# Patient Record
Sex: Female | Born: 1975 | Race: White | Hispanic: No | Smoking: Current every day smoker
Health system: Southern US, Community
[De-identification: ages and names within clinical notes are randomized; demographics above are authoritative.]

## PROBLEM LIST (undated history)

## (undated) DIAGNOSIS — F418 Other specified anxiety disorders: Secondary | ICD-10-CM

## (undated) DIAGNOSIS — I10 Essential (primary) hypertension: Secondary | ICD-10-CM

## (undated) DIAGNOSIS — K219 Gastro-esophageal reflux disease without esophagitis: Secondary | ICD-10-CM

## (undated) DIAGNOSIS — H6690 Otitis media, unspecified, unspecified ear: Secondary | ICD-10-CM

## (undated) HISTORY — PX: TUBAL LIGATION: SHX77

## (undated) HISTORY — DX: Other specified anxiety disorders: F41.8

## (undated) HISTORY — PX: OTHER SURGICAL HISTORY: SHX169

## (undated) HISTORY — PX: ENDOMETRIAL ABLATION: SHX621

---

## 2001-08-17 ENCOUNTER — Encounter: Payer: Self-pay | Admitting: Family Medicine

## 2001-08-17 ENCOUNTER — Ambulatory Visit (HOSPITAL_COMMUNITY): Admission: RE | Admit: 2001-08-17 | Discharge: 2001-08-17 | Payer: Self-pay | Admitting: Family Medicine

## 2002-03-12 ENCOUNTER — Emergency Department (HOSPITAL_COMMUNITY): Admission: EM | Admit: 2002-03-12 | Discharge: 2002-03-12 | Payer: Self-pay | Admitting: Emergency Medicine

## 2002-05-02 ENCOUNTER — Emergency Department (HOSPITAL_COMMUNITY): Admission: EM | Admit: 2002-05-02 | Discharge: 2002-05-02 | Payer: Self-pay | Admitting: Emergency Medicine

## 2002-05-02 ENCOUNTER — Encounter: Payer: Self-pay | Admitting: Emergency Medicine

## 2002-05-10 ENCOUNTER — Emergency Department (HOSPITAL_COMMUNITY): Admission: EM | Admit: 2002-05-10 | Discharge: 2002-05-10 | Payer: Self-pay | Admitting: Emergency Medicine

## 2002-06-13 ENCOUNTER — Emergency Department (HOSPITAL_COMMUNITY): Admission: EM | Admit: 2002-06-13 | Discharge: 2002-06-13 | Payer: Self-pay | Admitting: Emergency Medicine

## 2002-07-30 ENCOUNTER — Ambulatory Visit (HOSPITAL_COMMUNITY): Admission: RE | Admit: 2002-07-30 | Discharge: 2002-07-30 | Payer: Self-pay | Admitting: Pulmonary Disease

## 2003-11-19 ENCOUNTER — Inpatient Hospital Stay (HOSPITAL_COMMUNITY): Admission: RE | Admit: 2003-11-19 | Discharge: 2003-11-21 | Payer: Self-pay | Admitting: Obstetrics and Gynecology

## 2005-04-05 ENCOUNTER — Ambulatory Visit (HOSPITAL_COMMUNITY): Admission: RE | Admit: 2005-04-05 | Discharge: 2005-04-05 | Payer: Self-pay | Admitting: Pulmonary Disease

## 2006-01-05 ENCOUNTER — Ambulatory Visit (HOSPITAL_COMMUNITY): Admission: RE | Admit: 2006-01-05 | Discharge: 2006-01-05 | Payer: Self-pay | Admitting: Obstetrics & Gynecology

## 2006-01-18 ENCOUNTER — Ambulatory Visit (HOSPITAL_COMMUNITY): Admission: RE | Admit: 2006-01-18 | Discharge: 2006-01-18 | Payer: Self-pay | Admitting: Obstetrics and Gynecology

## 2006-02-02 ENCOUNTER — Encounter (HOSPITAL_COMMUNITY): Admission: RE | Admit: 2006-02-02 | Discharge: 2006-03-04 | Payer: Self-pay | Admitting: Obstetrics and Gynecology

## 2006-03-30 ENCOUNTER — Encounter (INDEPENDENT_AMBULATORY_CARE_PROVIDER_SITE_OTHER): Payer: Self-pay | Admitting: *Deleted

## 2006-03-30 ENCOUNTER — Other Ambulatory Visit: Admission: RE | Admit: 2006-03-30 | Discharge: 2006-03-30 | Payer: Self-pay | Admitting: Obstetrics and Gynecology

## 2006-04-14 IMAGING — US US ABDOMEN COMPLETE
1 series · 14 of 25 positions shown · non-contrast
Comparison: none

CLINICAL DATA: Right upper abdominal pain, nausea

[Series 1: unknown · 0.33mm/px · 14 of 60 slices shown]
[im 1/60]
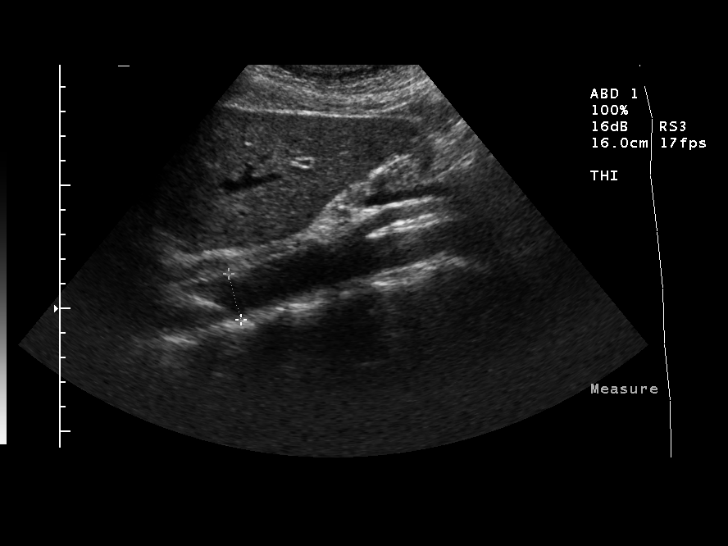
[im 5/60]
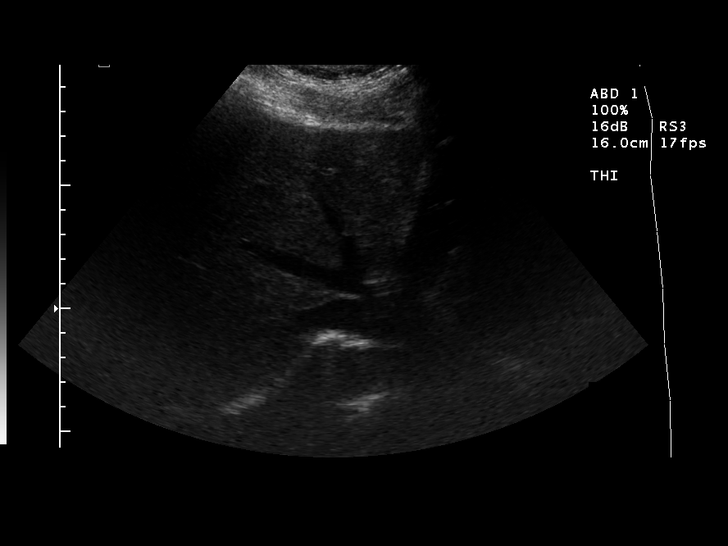
[im 10/60]
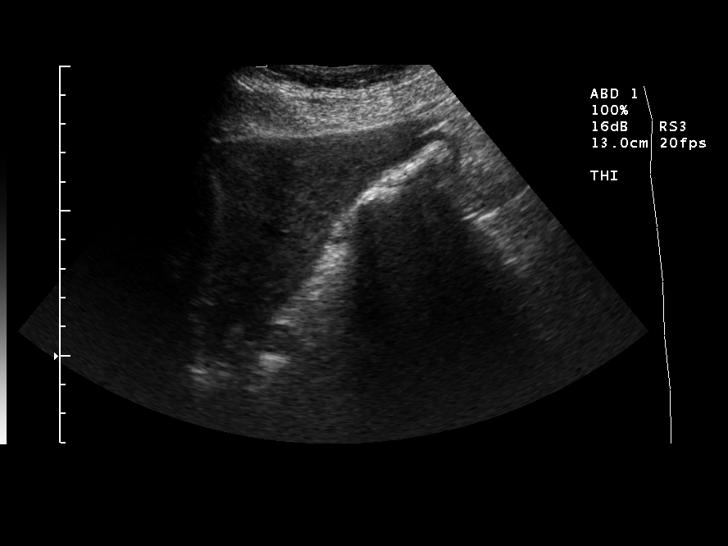
[im 15/60]
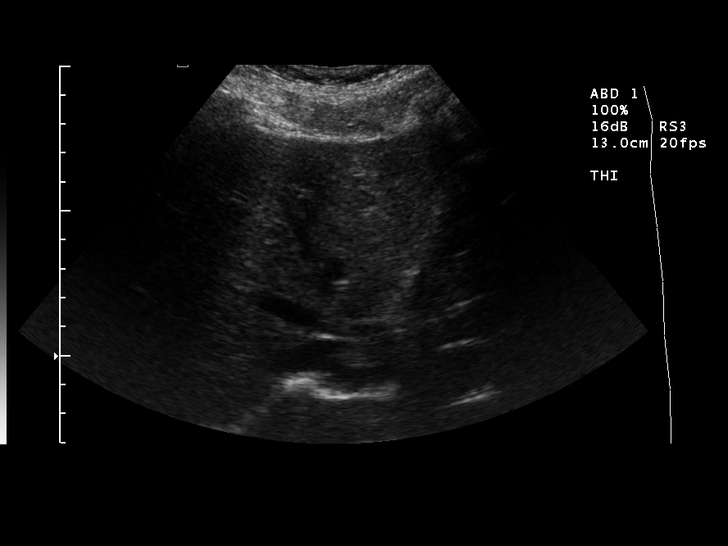
[im 20/60]
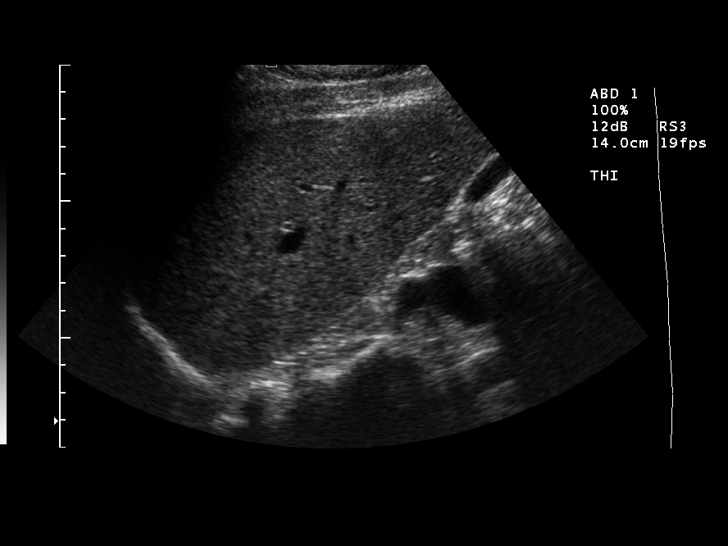
[im 23/60]
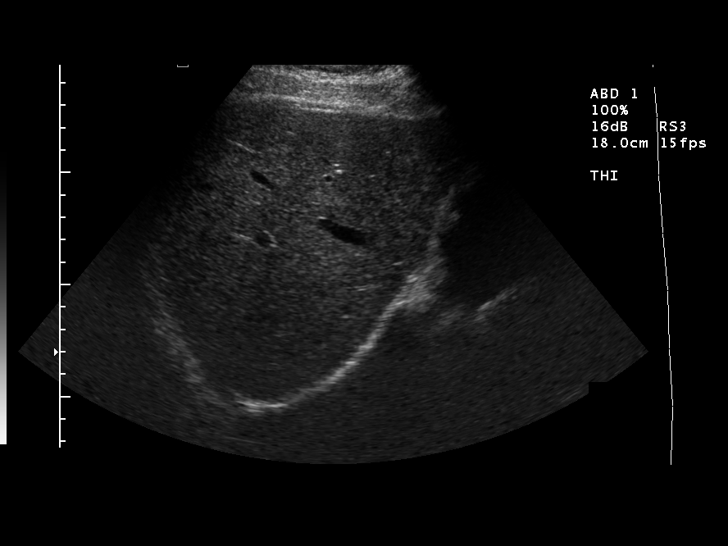
[im 28/60]
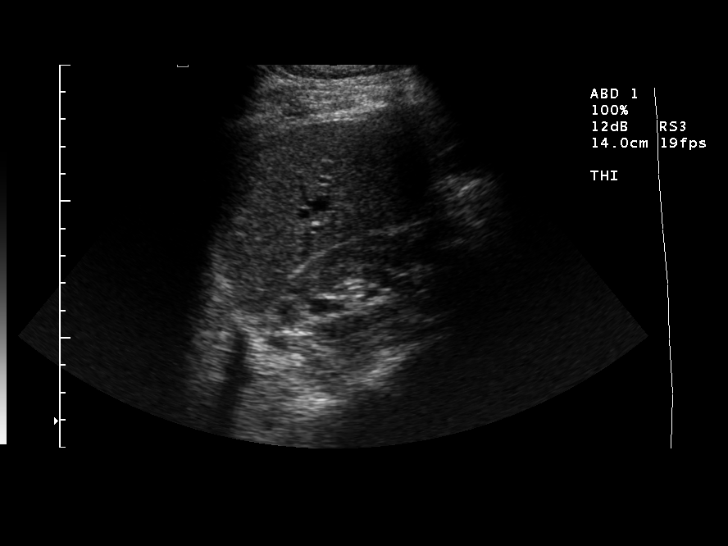
[im 32/60]
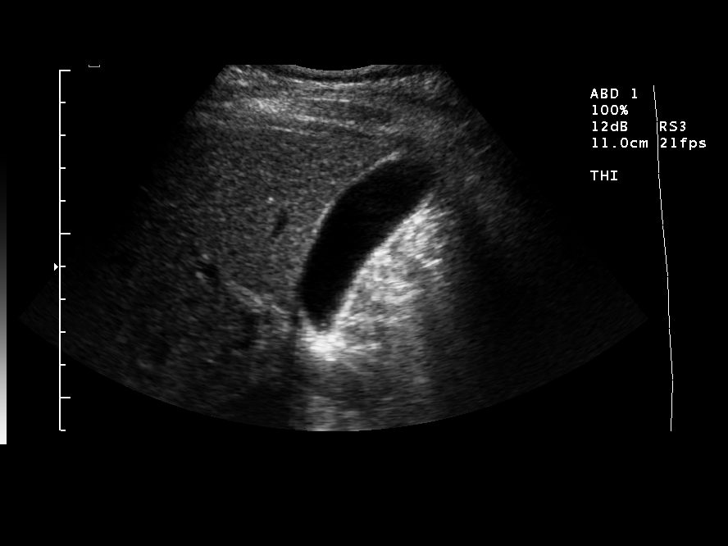
[im 37/60]
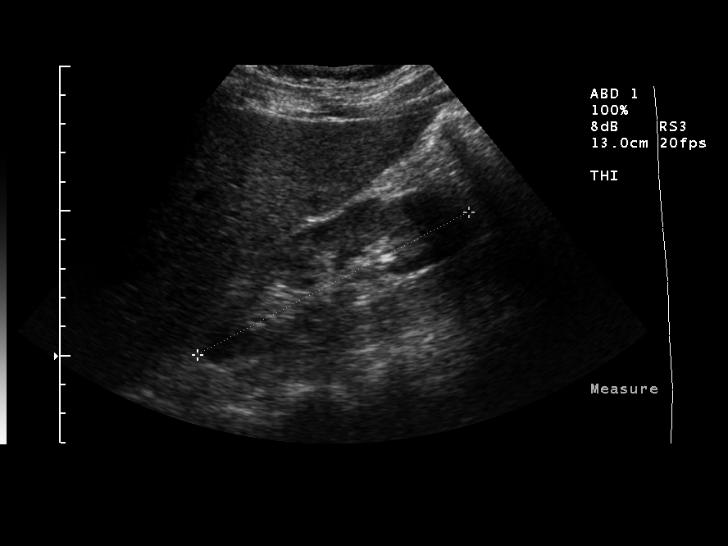
[im 40/60]
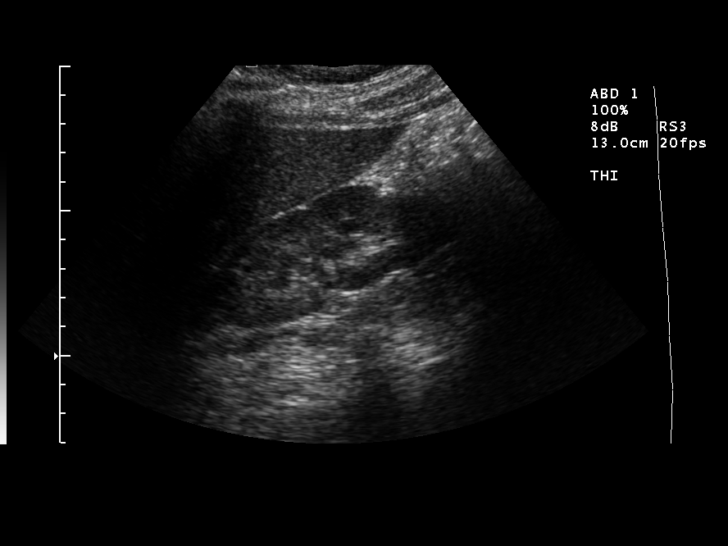
[im 45/60]
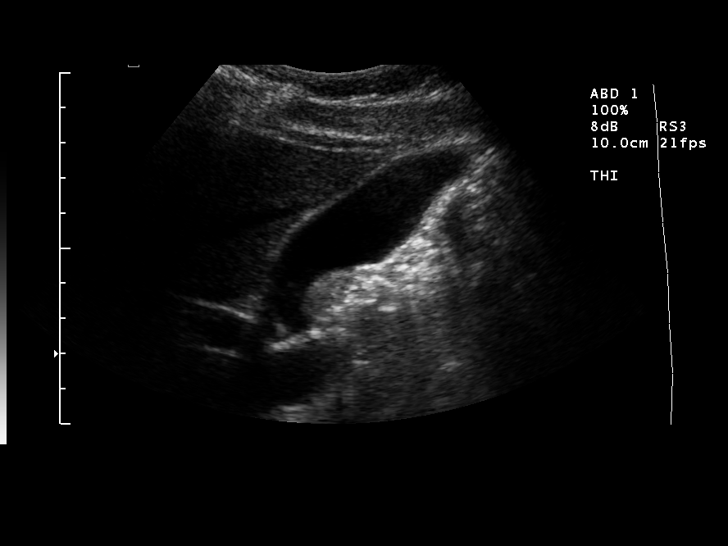
[im 50/60]
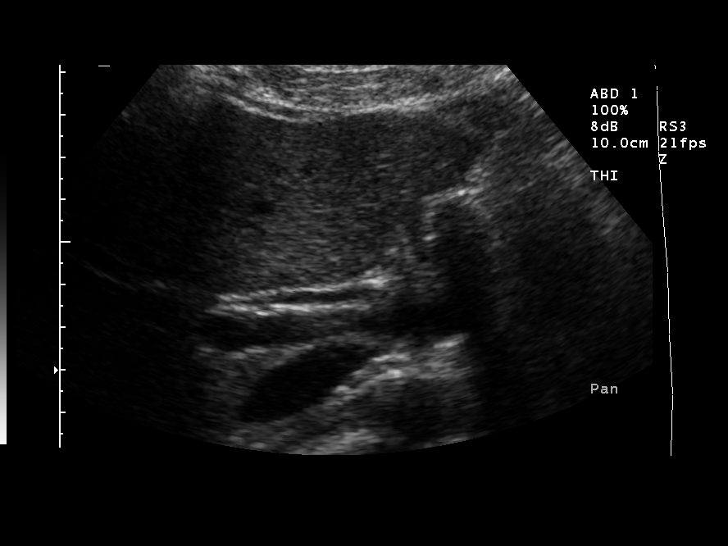
[im 55/60]
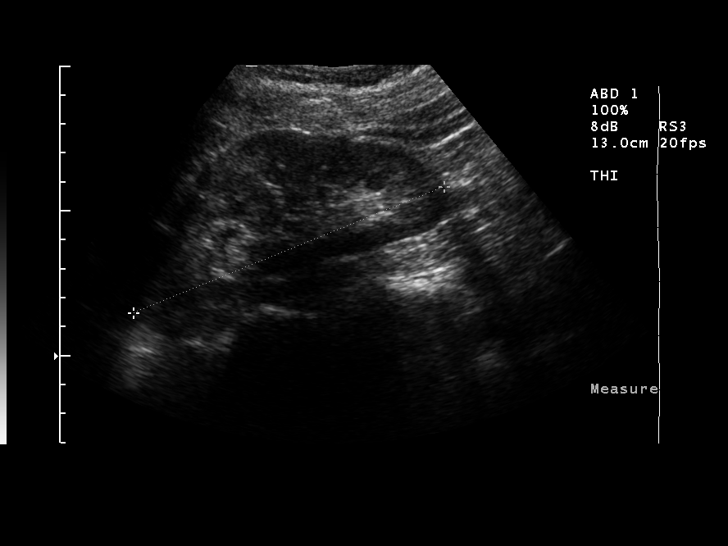
[im 60/60]
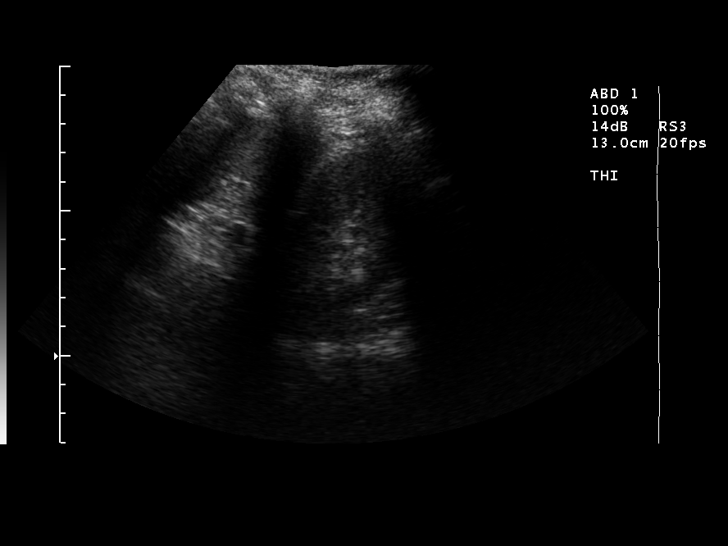

[14 of 25 positions shown; findings below may reference images not displayed]

Ultrasound abdomen:

No previous for comparison.  Complete abdominal ultrasound examination was
performed including evaluation of the liver, gallbladder, bile ducts, pancreas,
kidneys, spleen, IVC, and abdominal aorta.

There is no evidence of gallstones or biliary ductal dilatation.  The liver is
within normal limits in echogenicity, and no focal liver lesions are seen.  The
visualized portions of the IVC and pancreas are unremarkable.

There is no evidence of splenomegaly.  The kidneys are unremarkable, and there
is no evidence of hydronephrosis.  The abdominal aorta is non-dilated.
IMPRESSION: 1.  Negative abdominal ultrasound.

## 2006-04-21 ENCOUNTER — Encounter (INDEPENDENT_AMBULATORY_CARE_PROVIDER_SITE_OTHER): Payer: Self-pay | Admitting: *Deleted

## 2006-04-21 ENCOUNTER — Ambulatory Visit (HOSPITAL_COMMUNITY): Admission: RE | Admit: 2006-04-21 | Discharge: 2006-04-21 | Payer: Self-pay | Admitting: Obstetrics and Gynecology

## 2006-08-03 ENCOUNTER — Ambulatory Visit (HOSPITAL_COMMUNITY): Admission: RE | Admit: 2006-08-03 | Discharge: 2006-08-03 | Payer: Self-pay | Admitting: Pulmonary Disease

## 2006-09-19 ENCOUNTER — Encounter (HOSPITAL_COMMUNITY): Admission: RE | Admit: 2006-09-19 | Discharge: 2006-09-24 | Payer: Self-pay | Admitting: Pulmonary Disease

## 2006-09-28 ENCOUNTER — Encounter (HOSPITAL_COMMUNITY): Admission: RE | Admit: 2006-09-28 | Discharge: 2006-10-28 | Payer: Self-pay | Admitting: Pulmonary Disease

## 2006-10-01 ENCOUNTER — Emergency Department (HOSPITAL_COMMUNITY): Admission: EM | Admit: 2006-10-01 | Discharge: 2006-10-01 | Payer: Self-pay | Admitting: Emergency Medicine

## 2008-09-13 ENCOUNTER — Other Ambulatory Visit: Admission: RE | Admit: 2008-09-13 | Discharge: 2008-09-13 | Payer: Self-pay | Admitting: Obstetrics and Gynecology

## 2011-01-16 ENCOUNTER — Encounter: Payer: Self-pay | Admitting: Internal Medicine

## 2011-05-14 NOTE — Op Note (Signed)
NAME:  [name], MCNEELY                         ACCOUNT NO.:  [id number]   MEDICAL RECORD NO.:  [id number]                   PATIENT TYPE:  INP   LOCATION:  LDR1                                 FACILITY:  [facility]   PHYSICIAN:  Lazaro Arms, M.D.                DATE OF BIRTH:  11-06-1976   DATE OF PROCEDURE:  11/19/2003  DATE OF DISCHARGE:                                  PROCEDURE NOTE   PROCEDURE:  Epidural placement.   INDICATIONS:  [name] is a 35 year old white female, gravida 3, para 2,  requesting an epidural to be placed.  She is 5 cm dilated.  Want to get it  place before she has started in labor.   DISPOSITION:  The patient was placed in the sitting position.  The iliac  crest was identified.  L2-3 interspace identified.  One percent lidocaine  was injected.  A 17 gauge Tuohy needle was used.  I kept hitting bone deep,  which made me think that I was off the midline, but in fact I was on the  midline.  It was just that she had a narrow space.  I made four passes, but  only one pass in the epidural space.  It was located with a loss of  resistance technique without difficulty.  The 10 mL test dose of 0.125%  bupivacaine was given.  An additional 10 mL was given to dose it up and she  was started on 12 mL of 0.125% bupivacaine and 2 mcg/mL of fentanyl.      ___________________________________________                                            Lazaro Arms, M.D.   LHE/MEDQ  D:  11/19/2003  T:  11/19/2003  Job:  [id number]

## 2011-05-14 NOTE — H&P (Signed)
NAMEMARTY, [name]               ACCOUNT NO.:  [id number]   MEDICAL RECORD NO.:  [id number]          PATIENT TYPE:  AMB   LOCATION:  DAY                           FACILITY:  [facility]   PHYSICIAN:  Tilda Burrow, M.D. DATE OF BIRTH:  02/18/1976   DATE OF ADMISSION:  DATE OF DISCHARGE:  LH                                HISTORY & PHYSICAL   ADMISSION DIAGNOSES:  1.  Cervical dysplasia, CIN3.  2.  Menorrhagia, status post tubal ligation.  3.  Clitoral sebaceous cyst.  4.  Right abdominal wall skin tag, scheduled for excision.   HISTORY OF PRESENT ILLNESS:  This 35 year old female status post tubal  ligation January this year is admitted at this time for multiple procedure.  Firstly, Bobbie's cervical exam showed high-grade dysplasia. Colposcopic  biopsies returned showing CIN3 on cervical biopsies. There was endocervical  involvement as well. She will require cold-knife conization for excision of  the dysplastic area. Endocervical curettage was negative, though there was  endocervical involvement on the cervical biopsy. Cold-knife conization with  a 90% success rate has been quoted to patient.   The second problem is that her menses have been become unacceptably heavy  since she had her sterilization and removal of hormone-containing IUD. We  discussed proceeding directly to hysterectomy to solve both cervical and  endometrial frustration with heavy menses. The patient declines this due to  recovery time and other obligations with child bearing, etc. A 90% quote for  satisfactory resolution of the bleeding is quoted as well. The patient has  seen the endometrial ablation patient video. Questions answered to patient's  satisfaction, with risks of procedure and potential success rates reviewed.   Additionally, the patient during exam has been found to have an area of  sebaceous cyst within the body of the clitoris. This appears to be a  sebaceous inclusion cyst in the skin which will  require excision for  resolution. Plans are to remove this at the time of the outpatient procedure  while she is under anesthesia.   Finally, the patient has a small skin tag on the right side of the abdominal  wall which she had intended to have removed earlier but failed to do so. The  plans are to remove that as a minor procedure while under anesthesia.   PAST MEDICAL HISTORY:  Otherwise benign.   PAST SURGICAL HISTORY:  Tubal ligation 2007.   ALLERGIES:  None.   HABITS:  Cigarettes:  One pack per day. Alcohol and recreational drugs  denied.   OBSTETRICAL HISTORY:  Gravida 3, para 3.   PHYSICAL EXAMINATION:  VITAL SIGNS:  Height 5 foot 2, weight 170-1/2. Blood  pressure 120/68.  HEENT:  Pupils are equal, round, and reactive. Extraocular movements intact.  NECK:  Supple. Trachea midline.  CHEST:  Clear to auscultation.  ABDOMEN:  Nontender.  EXTERNAL GENITALIA:  Multiparous.   PLAN:  Four procedures to be performed April 21, 2006.      Tilda Burrow, M.D.  Electronically Signed     JVF/MEDQ  D:  04/19/2006  T:  04/19/2006  Job:  [id number]

## 2011-05-14 NOTE — Op Note (Signed)
NAMEJOELYNN, DUST               ACCOUNT NO.:  [id number]   MEDICAL RECORD NO.:  [id number]          PATIENT TYPE:  AMB   LOCATION:  DAY                           FACILITY:  [facility]   PHYSICIAN:  Lazaro Arms, M.D.   DATE OF BIRTH:  05-03-1976   DATE OF PROCEDURE:  01/05/2006  DATE OF DISCHARGE:  01/05/2006                                 OPERATIVE REPORT   PREOPERATIVE DIAGNOSES:  1.  Multiparous female who desires permanent sterilization.  2.  Current intrauterine device.   POSTOPERATIVE DIAGNOSIS:  1.  Multiparous female who desires permanent sterilization.  2.  Current intrauterine device.   PROCEDURE:  Laparoscopic tubal ligation with removal of intrauterine device.   SURGEON:  Lazaro Arms, M.D.   ANESTHESIA:  General endotracheal.   FINDINGS:  The patient had normal uterus, tubes and ovaries. Pelvis  completely normal.   DESCRIPTION OF OPERATION:  The patient was taken to the operating room and  placed in supine position and underwent general endotracheal anesthesia,  placed in dorsal lithotomy position, prepped and draped in usual sterile  fashion. Incision made in the umbilicus. Veress needle was used. Peritoneal  cavity was insufflated without difficulty. A nonbladed trocar was used with  direct videolaparoscope visualization and placed in the peritoneal cavity  with one pass without difficulty. Fallopian tubes were identified. They were  burned to no resistance and beyond using an electrocautery unit in the  distal isthmic ampullary region of each tube. There was good hemostasis  bilaterally; 2.5-cm segment bilaterally was coagulated. The instruments were  removed. The gas was allowed to escape. The fascia was closed using 0 Vicryl  suture. Subcutaneous tissue was closed with 3-0 Vicryl as well. Skin was  closed using skin staples. The patient tolerated the procedure well. She  experienced minimal blood loss and was taken to the recovery room in good  and stable  condition. All counts were correct.      Lazaro Arms, M.D.  Electronically Signed     LHE/MEDQ  D:  02/16/2006  T:  02/16/2006  Job:  [id number]

## 2011-05-14 NOTE — Op Note (Signed)
NAME:  [name], VISCONTI                         ACCOUNT NO.:  [id number]   MEDICAL RECORD NO.:  [id number]                   PATIENT TYPE:  INP   LOCATION:  LDR1                                 FACILITY:  [facility]   PHYSICIAN:  Lazaro Arms, M.D.                DATE OF BIRTH:  1976-12-01   DATE OF PROCEDURE:  DATE OF DISCHARGE:                                 OPERATIVE REPORT   DELIVERY NOTE   Ruggerio was noted to be fully dilated at +3 station and I was called to  attend the delivery. After about a 20-minute second stage she delivered a  viable female infant at 10 o'clock.  The mouth and nose were suctioned on the  perineum; and the body was delivered without difficulty.  Apgars are 9 and  9.  Weight 6 pounds 10 ounces.   Twenty units of Pitocin diluted in 1000 cc of lactated Ringers was then  infused rapidly IV.  The placenta separated spontaneously and was delivered  by a controlled cord traction at 10:05.  It was inspected and appears to be  intact with a 3-vessel cord.  The vagina was then inspected and a left labia  minora lacerations was found.  It was repaired under epidural anesthesia  using a 3-0 Monocryl suture.  The fundus is firm to below the umbilicus and  lochia is small.  Estimated blood loss less than 200 cc.  The epidural  catheter was removed with the blue tip visualized as being intact.     ________________________________________  ___________________________________________  Jacklyn Shell, C.N.M.           Lazaro Arms, M.D.   FC/MEDQ  D:  11/19/2003  T:  11/19/2003  Job:  [id number]   cc:   [facility]

## 2011-05-14 NOTE — Op Note (Signed)
Cindy, Spencer               ACCOUNT NO.:  [id number]   MEDICAL RECORD NO.:  [id number]          PATIENT TYPE:  AMB   LOCATION:  DAY                           FACILITY:  [facility]   PHYSICIAN:  Tilda Burrow, M.D. DATE OF BIRTH:  02/06/1976   DATE OF PROCEDURE:  04/21/2006  DATE OF DISCHARGE:                                 OPERATIVE REPORT   PREOPERATIVE DIAGNOSIS:  Menorrhagia, cervical dysplasia, clitoral sebaceous  cyst, right abdominal wall skin tag.   POSTOPERATIVE DIAGNOSIS:  Menorrhagia, cervical dysplasia, clitoral  sebaceous cyst, right abdominal wall skin tag.   OPERATION PERFORMED:  1.  Hysteroscopy, dilation and curettage, endometrial ablation.  2.  Cold knife conization.  3.  Excision of clitoral sebaceous cyst.  4.  Excision of right abdominal wall skin tag.   SURGEON:  Tilda Burrow, M.D.   ASSISTANT:   ANESTHESIA:  General with laryngeal mask anesthesia,   COMPLICATIONS:  None.   FINDINGS:  1.  Uterus sounding to 9 cm, thin endometrium.  2.  Multiparous cervix, status post vaginal delivery.  3.  Subcutaneous 1.5 cm sebaceous cyst peeled out carefully.  4.  1.1 cm broad based skin tag, right abdominal wall.   DESCRIPTION OF PROCEDURE:  (1)  Endometrial ablation.  Endometrial ablation  was performed in standard fashion using GyneCare ThermaChoice 3 ablation  technique.  Cervix was grasped with single toothed tenaculum.  The uterus sounded to 7  cm, dilated to 23 [place] allowing introduction of a 30 degree operative  hysteroscope with visualization of the endometrial cavity showing thin  tissues with no suspicions of gross abnormality.  Smooth and sharp curettage  was briefly performed and minimal tissue fragments obtained.  The uterus was  irrigated again with a hysteroscope and then we proceeded with endometrial  ablation.  This consisted of priming the catheter, inserting it into the  endometrial cavity and inflating it with 13 mL of D5W,  achieving an 8 minute  cycle of thermal ablation with recovery of all the 13 mL of fluid.  The  procedure was satisfactorily completed.  The patient had received  paracervical block using Marcaine 5 to 7 mL preprocedure.   (2)  Cold knife conization.  Conization was performed using standard  technique with stitch placed at 3 o'clock and 9 o'clock with 2-0 chromic  followed by 3-0 chromic followed by an ellipse of exocervix and a cone-  shaped specimen going approximately 1.5 cm up the endocervical canal.  The  bed required point cautery with unipolar cautery to achieve adequate  hemostasis, then the Monsel solution was applied with 4 x 4 pressure applied  to it.  Speculum was left in place and decompressed to where it was not  abducting the vaginal side walls and left this way during the subsequent  sebaceous cyst removal.   (3)  Sebaceous cyst excision.  Clitoral hood was opened and an elliptical 1  cm incision in the midline exposing a smooth-walled sebaceous cyst and the  underlying connective tissue.  Patient sensitivity resulted and need for  paracervical block using again 2 mL  of Marcaine solution on each side of the  clitoris.  We then carefully peeled the cyst out of the underlying  connective tissue.  The cyst was opened during the final parts of the  procedure and was a thick viscous material, nonpurulent.  Specimen was  excised.  Bleeding was minimal, so we did not place any stitches.  Local  pressure will be considered adequate.   (4)  The right abdominal skin wall tag was then prepped, excised in a  transverse elliptical fashion and then three stitches of 3-0 Dexon used to  pull the skin edges in approximation around the base to achieve hemostasis.  The patient tolerated the procedure well and went to recovery room in good  condition.      Tilda Burrow, M.D.  Electronically Signed     JVF/MEDQ  D:  04/21/2006  T:  04/22/2006  Job:  [id number]

## 2011-05-14 NOTE — H&P (Signed)
NAME:  LEATA, DOMINY NO.:  [id number]   MEDICAL RECORD NO.:  [id number]                   PATIENT TYPE:  INP   LOCATION:  LDR1                                 FACILITY:  [facility]   PHYSICIAN:  Lazaro Arms, M.D.                DATE OF BIRTH:  [birth year]   DATE OF ADMISSION:  11/19/2003  DATE OF DISCHARGE:                                HISTORY & PHYSICAL   HISTORY OF PRESENT ILLNESS:  [name] is a 35 year old white female gravida  3, para 2, abortus 0, estimated date of delivery of December 02, 2003  currently at 38-2/[redacted] weeks gestation.  The patient was seen in the office  yesterday and found to be 5 cm dilated having no labor, as a result she was  admitted for induction today.   PAST MEDICAL HISTORY:  Negative.   PAST SURGICAL HISTORY:  Negative.   PAST OBSTETRICAL HISTORY:  Two vaginal deliveries 1995 and 1998.   ALLERGIES:  None.   MEDICATIONS:  Prenatal vitamins and Wellbutrin.   SOCIAL HISTORY:  She is single, lives with father of the baby.  She is a  Production designer, theatre/television/film.   LABORATORIES:  Her blood is O positive, antibody screen is negative,  serology is nonreactive.  HIV is negative.  Hepatitis B is negative.  Pap  smear was ascus, needed to be repeated postpartum.  GC and Chlamydia were  negative.  AFP was normal.  Group B strep was positive in a prior pregnancy.  Her Glucola was 96.   PHYSICAL EXAMINATION:  HEENT:  Unremarkable.  THYROID:  Normal.  LUNGS:  Clear.  HEART:  Regular rate and rhythm without murmurs, rubs, or gallops.  BREASTS:  Without mass, discharge, skin changes.  ABDOMEN:  Fundal height of 35 cm.  CERVIX:  5, 90, -1, vertex, soft, midplane, bulging bag of water.   IMPRESSION:  1. Intrauterine pregnancy at 38-2/[redacted] weeks gestation.  2. Advanced cervical dilatation.   PLAN:  Patient admitted for induction of labor.  She understands the  indications and will proceed.  She does want an epidural.     ___________________________________________                                         Lazaro Arms, M.D.   LHE/MEDQ  D:  11/19/2003  T:  11/19/2003  Job:  [id number]

## 2013-12-27 HISTORY — PX: TOOTH EXTRACTION: SUR596

## 2014-06-13 ENCOUNTER — Encounter: Payer: Self-pay | Admitting: *Deleted

## 2014-07-18 ENCOUNTER — Encounter (INDEPENDENT_AMBULATORY_CARE_PROVIDER_SITE_OTHER): Payer: Self-pay

## 2014-07-18 ENCOUNTER — Encounter (HOSPITAL_COMMUNITY): Payer: Self-pay | Admitting: Pharmacy Technician

## 2014-07-18 ENCOUNTER — Encounter: Payer: Self-pay | Admitting: Gastroenterology

## 2014-07-18 ENCOUNTER — Ambulatory Visit (INDEPENDENT_AMBULATORY_CARE_PROVIDER_SITE_OTHER): Payer: Medicaid Other | Admitting: Gastroenterology

## 2014-07-18 VITALS — BP 134/82 | HR 95 | Temp 97.7°F | Ht 64.0 in | Wt 128.4 lb

## 2014-07-18 DIAGNOSIS — R195 Other fecal abnormalities: Secondary | ICD-10-CM

## 2014-07-18 LAB — COMPREHENSIVE METABOLIC PANEL
ALK PHOS: 56 U/L (ref 39–117)
ALT: 10 U/L (ref 0–35)
AST: 13 U/L (ref 0–37)
Albumin: 4.5 g/dL (ref 3.5–5.2)
BUN: 7 mg/dL (ref 6–23)
CALCIUM: 9.6 mg/dL (ref 8.4–10.5)
CO2: 25 meq/L (ref 19–32)
CREATININE: 0.71 mg/dL (ref 0.50–1.10)
Chloride: 103 mEq/L (ref 96–112)
GLUCOSE: 128 mg/dL — AB (ref 70–99)
Potassium: 4.5 mEq/L (ref 3.5–5.3)
Sodium: 137 mEq/L (ref 135–145)
Total Bilirubin: 0.3 mg/dL (ref 0.2–1.2)
Total Protein: 6.7 g/dL (ref 6.0–8.3)

## 2014-07-18 LAB — CBC
HEMATOCRIT: 41.1 % (ref 36.0–46.0)
HEMOGLOBIN: 14.3 g/dL (ref 12.0–15.0)
MCH: 31.1 pg (ref 26.0–34.0)
MCHC: 34.8 g/dL (ref 30.0–36.0)
MCV: 89.3 fL (ref 78.0–100.0)
Platelets: 285 10*3/uL (ref 150–400)
RBC: 4.6 MIL/uL (ref 3.87–5.11)
RDW: 12.7 % (ref 11.5–15.5)
WBC: 8 10*3/uL (ref 4.0–10.5)

## 2014-07-18 LAB — TSH: TSH: 0.7 u[IU]/mL (ref 0.350–4.500)

## 2014-07-18 MED ORDER — DICYCLOMINE HCL 10 MG PO CAPS: 10.0000 mg | ORAL_CAPSULE | Freq: Three times a day (TID) | ORAL | Status: DC

## 2014-07-18 MED ORDER — ONDANSETRON 4 MG PO TBDP: 4.0000 mg | ORAL_TABLET | Freq: Three times a day (TID) | ORAL | Status: DC | PRN

## 2014-07-18 MED ORDER — PEG-KCL-NACL-NASULF-NA ASC-C 100 G PO SOLR: 1.0000 | ORAL | Status: DC

## 2014-07-18 NOTE — Assessment & Plan Note (Signed)
38 year old female with significant change in bowel habits to include significant diarrhea, fecal incontinence, and possible small volume hematochezia, with exposure to Cdiff prior to onset. Associated weight loss, abdominal cramping, and nausea. Concern for infectious etiology, unable to rule out evolving IBD. Needs TSH, CBC, celiac serologies, and stool studies now with colonoscopy in the next few weeks for diagnostic purposes.   Proceed with colonoscopy with Dr. Oneida Alar in the near future. The risks, benefits, and alternatives have been discussed in detail with the patient. They state understanding and desire to proceed.  Bentyl for supportive measures Zofran for nausea Probiotic daily Labs and stool studies as outlined

## 2014-07-18 NOTE — Progress Notes (Signed)
Referring Provider: Dr. Luan Pulling Primary Care Physician:  Alonza Bogus, MD Primary Gastroenterologist:  Dr. Oneida Alar   Chief Complaint  Patient presents with  . Diarrhea  . Abdominal Pain    HPI:   Very pleasant 37 year old female referred by Dr. Luan Pulling secondary to diarrhea. Diarrhea for 8-9 months. Prior to this would have solid bowel movements. Notes fecal incontinence. Postprandial component. Multiple loose stools. Has to pull over on the way to work. May have seen small amount of blood but not sure. Looks like food is not digested. Associated lower abdominal discomfort, sometimes RUQ discomfort. Associated weight loss of close to 20 lbs. Nausea, upper abdominal burning. Usually worse in morning when first drinking. No vomiting. Has seen possible black stool in the remote past. Sometimes looks like jelly. Rare NSAIDs. No reflux symptoms.   Prescribed abx for upper respiratory issues in early June. Mom had Cdiff while hospitalized in Aug/Sept 2014.   Past Medical History  Diagnosis Date  . Depression with anxiety     Past Surgical History  Procedure Laterality Date  . Tubal ligation    . Endometrial ablation      Current Outpatient Prescriptions  Medication Sig Dispense Refill  . ALPRAZolam (XANAX) 1 MG tablet Take 1 mg by mouth 3 (three) times daily.      Marland Kitchen HYDROcodone-acetaminophen (NORCO) 10-325 MG per tablet Take 1 tablet by mouth every 4 (four) hours as needed.      . methocarbamol (ROBAXIN) 500 MG tablet Take 500 mg by mouth 3 (three) times daily.      . sertraline (ZOLOFT) 50 MG tablet Take 50 mg by mouth daily.      Marland Kitchen zolpidem (AMBIEN) 10 MG tablet Take 10 mg by mouth at bedtime.       No current facility-administered medications for this visit.    Allergies as of 07/18/2014  . (No Known Allergies)    Family History  Problem Relation Age of Onset  . Colon cancer Neg Hx   . Celiac disease Neg Hx   . Crohn's disease Neg Hx   . Ulcerative colitis Neg  Hx     History   Social History  . Marital Status: Divorced    Spouse Name: N/A    Number of Children: N/A  . Years of Education: N/A   Occupational History  . [employer] Staffing     [employer]   Social History Main Topics  . Smoking status: Current Every Day Smoker  . Smokeless tobacco: Not on file     Comment: less than a pack  . Alcohol Use: No  . Drug Use: No  . Sexual Activity: Yes   Other Topics Concern  . Not on file   Social History Narrative  . No narrative on file    Review of Systems: As mentioned in HPI.   Physical Exam: BP 134/82  Pulse 95  Temp(Src) 97.7 F (36.5 C) (Oral)  Ht 5\' 4"  (1.626 m)  Wt 128 lb 6.4 oz (58.242 kg)  BMI 22.03 kg/m2 General:   Alert and oriented. Well-developed, well-nourished, pleasant and cooperative. Head:  Normocephalic and atraumatic. Eyes:  Conjunctiva pink, sclera clear, no icterus.   Conjunctiva pink. Ears:  Normal auditory acuity. Nose:  No deformity, discharge,  or lesions. Mouth:  No deformity or lesions, mucosa pink and moist.  Lungs:  Clear to auscultation bilaterally, without wheezing, rales, or rhonchi.  Heart:  S1, S2 present without murmurs noted.  Abdomen:  +BS, soft,  non-tender and non-distended. Without mass or HSM. No rebound or guarding. No hernias noted. Rectal:  Deferred  Msk:  Symmetrical without gross deformities. Normal posture. Extremities:  Without clubbing or edema. Neurologic:  Alert and  oriented x4;  grossly normal neurologically. Skin:  Intact, warm and dry without significant lesions or rashes Psych:  Alert and cooperative. Normal mood and affect.

## 2014-07-18 NOTE — Patient Instructions (Signed)
Please have blood work completed today. Return the stool samples to the lab when able.   I have sent a prescription for Bentyl to your pharmacy to take with meals and at bedtime. This is for cramping and loose stool.  Start taking a probiotic daily such as Digestive Advantage, Philip's Colon Health, [employer] brand, Restora, and Align.   We have scheduled you for a colonoscopy with Dr. Oneida Alar in a few weeks. Further recommendations to follow!

## 2014-07-19 LAB — TISSUE TRANSGLUTAMINASE, IGA: Tissue Transglutaminase Ab, IgA: 4.4 U/mL (ref <20)

## 2014-07-21 LAB — CLOSTRIDIUM DIFFICILE BY PCR: Toxigenic C. Difficile by PCR: NOT DETECTED

## 2014-07-22 LAB — GIARDIA ANTIGEN: GIARDIA SCREEN (EIA): NEGATIVE

## 2014-07-22 NOTE — Progress Notes (Signed)
cc'd to pcp 

## 2014-07-23 LAB — STOOL CULTURE

## 2014-07-30 ENCOUNTER — Encounter (HOSPITAL_COMMUNITY)
Admission: RE | Disposition: A | Discharge status: Home / Self Care | Payer: Self-pay | Source: Ambulatory Visit | Attending: Gastroenterology

## 2014-07-30 ENCOUNTER — Ambulatory Visit (HOSPITAL_COMMUNITY)
Admission: RE | Admit: 2014-07-30 | Discharge: 2014-07-30 | Disposition: A | Discharge status: Home / Self Care | Payer: Medicaid Other | Source: Ambulatory Visit | Attending: Gastroenterology | Admitting: Gastroenterology

## 2014-07-30 ENCOUNTER — Encounter (HOSPITAL_COMMUNITY): Payer: Self-pay | Admitting: *Deleted

## 2014-07-30 DIAGNOSIS — F3289 Other specified depressive episodes: Secondary | ICD-10-CM | POA: Insufficient documentation

## 2014-07-30 DIAGNOSIS — Z79899 Other long term (current) drug therapy: Secondary | ICD-10-CM | POA: Insufficient documentation

## 2014-07-30 DIAGNOSIS — K648 Other hemorrhoids: Secondary | ICD-10-CM | POA: Diagnosis not present

## 2014-07-30 DIAGNOSIS — R109 Unspecified abdominal pain: Secondary | ICD-10-CM | POA: Insufficient documentation

## 2014-07-30 DIAGNOSIS — F411 Generalized anxiety disorder: Secondary | ICD-10-CM | POA: Insufficient documentation

## 2014-07-30 DIAGNOSIS — K921 Melena: Secondary | ICD-10-CM

## 2014-07-30 DIAGNOSIS — F17200 Nicotine dependence, unspecified, uncomplicated: Secondary | ICD-10-CM | POA: Diagnosis not present

## 2014-07-30 DIAGNOSIS — F329 Major depressive disorder, single episode, unspecified: Secondary | ICD-10-CM | POA: Insufficient documentation

## 2014-07-30 DIAGNOSIS — D126 Benign neoplasm of colon, unspecified: Secondary | ICD-10-CM | POA: Diagnosis not present

## 2014-07-30 DIAGNOSIS — Z800 Family history of malignant neoplasm of digestive organs: Secondary | ICD-10-CM | POA: Diagnosis not present

## 2014-07-30 DIAGNOSIS — K625 Hemorrhage of anus and rectum: Secondary | ICD-10-CM | POA: Insufficient documentation

## 2014-07-30 DIAGNOSIS — R197 Diarrhea, unspecified: Secondary | ICD-10-CM | POA: Diagnosis not present

## 2014-07-30 DIAGNOSIS — R195 Other fecal abnormalities: Secondary | ICD-10-CM

## 2014-07-30 HISTORY — DX: Otitis media, unspecified, unspecified ear: H66.90

## 2014-07-30 HISTORY — PX: COLONOSCOPY: SHX5424

## 2014-07-30 SURGERY — COLONOSCOPY
Anesthesia: Moderate Sedation

## 2014-07-30 MED ORDER — MEPERIDINE HCL 100 MG/ML IJ SOLN
INTRAMUSCULAR | Status: DC | PRN
  Administered 2014-07-30 (×3): 50 mg

## 2014-07-30 MED ORDER — DICYCLOMINE HCL 10 MG PO CAPS: ORAL_CAPSULE | ORAL | Status: DC

## 2014-07-30 MED ORDER — SODIUM CHLORIDE 0.9 % IV SOLN
INTRAVENOUS | Status: DC
  Administered 2014-07-30: 12:00:00 via INTRAVENOUS

## 2014-07-30 MED ORDER — MEPERIDINE HCL 100 MG/ML IJ SOLN
INTRAMUSCULAR | Status: AC
  Filled 2014-07-30: qty 2

## 2014-07-30 MED ORDER — PROMETHAZINE HCL 25 MG/ML IJ SOLN
INTRAMUSCULAR | Status: AC
  Filled 2014-07-30: qty 1

## 2014-07-30 MED ORDER — PROMETHAZINE HCL 25 MG/ML IJ SOLN
12.5000 mg | Freq: Once | INTRAMUSCULAR | Status: AC
  Administered 2014-07-30: 12.5 mg via INTRAVENOUS

## 2014-07-30 MED ORDER — MIDAZOLAM HCL 5 MG/5ML IJ SOLN
INTRAMUSCULAR | Status: DC | PRN
  Administered 2014-07-30 (×3): 2 mg via INTRAVENOUS

## 2014-07-30 MED ORDER — SODIUM CHLORIDE 0.9 % IJ SOLN
INTRAMUSCULAR | Status: AC
  Filled 2014-07-30: qty 10

## 2014-07-30 MED ORDER — MIDAZOLAM HCL 5 MG/5ML IJ SOLN
INTRAMUSCULAR | Status: AC
  Filled 2014-07-30: qty 10

## 2014-07-30 NOTE — H&P (Signed)
Primary Care Physician:  Alonza Bogus, MD Primary Gastroenterologist:  Dr. Oneida Alar  Pre-Procedure History & Physical:  HPI:  [name] is a 38 y.o. female here for  DIARRHEA/ABDOMINAL PAIN/?brbpr.   Past Medical History  Diagnosis Date  . Depression with anxiety   . Acute ear infection     Past Surgical History  Procedure Laterality Date  . Tubal ligation    . Endometrial ablation      Prior to Admission medications   Medication Sig Start Date End Date Taking? Authorizing Provider  ALPRAZolam Duanne Moron) 1 MG tablet Take 1 mg by mouth 3 (three) times daily.   Yes Historical Provider, MD  cefdinir (OMNICEF) 300 MG capsule Take 300 mg by mouth 2 (two) times daily.   Yes Historical Provider, MD  dicyclomine (BENTYL) 10 MG capsule Take 1 capsule (10 mg total) by mouth 4 (four) times daily -  before meals and at bedtime. 07/18/14  Yes Orvil Feil, NP  HYDROcodone-acetaminophen (NORCO) 10-325 MG per tablet Take 1 tablet by mouth every 4 (four) hours as needed for moderate pain.    Yes Historical Provider, MD  methocarbamol (ROBAXIN) 500 MG tablet Take 500 mg by mouth 3 (three) times daily.   Yes Historical Provider, MD  ondansetron (ZOFRAN ODT) 4 MG disintegrating tablet Take 1 tablet (4 mg total) by mouth every 8 (eight) hours as needed for nausea or vomiting. 07/18/14  Yes Orvil Feil, NP  peg 3350 powder (MOVIPREP) 100 G SOLR Take 1 kit (200 g total) by mouth as directed. 07/18/14  Yes Danie Binder, MD  Probiotic Product (ALIGN) 4 MG CAPS Take 1 capsule by mouth daily.   Yes Historical Provider, MD  sertraline (ZOLOFT) 50 MG tablet Take 50 mg by mouth daily.   Yes Historical Provider, MD  zolpidem (AMBIEN) 10 MG tablet Take 10 mg by mouth at bedtime.   Yes Historical Provider, MD    Allergies as of 07/18/2014  . (No Known Allergies)    Family History  Problem Relation Age of Onset  . Celiac disease Neg Hx   . Crohn's disease Neg Hx   . Ulcerative colitis Neg Hx   . Colon  cancer Mother     History   Social History  . Marital Status: Divorced    Spouse Name: N/A    Number of Children: N/A  . Years of Education: N/A   Occupational History  . 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 Smoking status: Current Every Day Smoker -- 1.00 packs/day for 22 years    Types: Cigarettes  . Smokeless tobacco: Not on file     Comment: less than a pack  . Alcohol Use: No  . Drug Use: No  . Sexual Activity: Yes   Other Topics Concern  . Not on file   Social History Narrative  . No narrative on file    Review of Systems: See HPI, otherwise negative ROS   Physical Exam: BP 123/72  Pulse 77  Temp(Src) 98 F (36.7 C) (Oral)  Resp 22  Ht '5\' 4"'  (1.626 m)  Wt 128 lb (58.06 kg)  BMI 21.96 kg/m2  SpO2 100% General:   Alert,  pleasant and cooperative in NAD Head:  Normocephalic and atraumatic. Neck:  Supple; Lungs:  Clear throughout to auscultation.    Heart:  Regular rate and rhythm. Abdomen:  Soft, nontender and nondistended. Normal bowel sounds, without guarding, and without rebound.  Neurologic:  Alert and  oriented x4;  grossly normal neurologically.  Impression/Plan:     Diarrhea/BRBPR  PLAN: TCS TODAY WITH BIOPSY

## 2014-07-30 NOTE — Progress Notes (Signed)
Quick Note:  Negative stool studies.  Normal CBC and TSH.  Mildly elevated blood glucose (was this fasting? ) Patient had procedures today. ______

## 2014-07-30 NOTE — Progress Notes (Signed)
REVIEWED.  

## 2014-07-31 NOTE — Progress Notes (Signed)
Quick Note:  Pt called and was informed. ______ 

## 2014-07-31 NOTE — Progress Notes (Signed)
Quick Note:  LMOM to call. ______ 

## 2014-07-31 NOTE — Op Note (Signed)
[facility] [address] [place], [address]   COLONOSCOPY PROCEDURE REPORT  PATIENT: [name], [name]  MR#: [id number] BIRTHDATE: July 27, 1976 , 38  yrs. old GENDER: Female ENDOSCOPIST: Barney Drain, MD REFERRED QM:GNOIBB Luan Pulling, M.D. PROCEDURE DATE:  07/30/2014 PROCEDURE:   Colonoscopy with biopsy and with cold biopsy polypectomy INDICATIONS:unexplained diarrhea, Rectal Bleeding, and Abdominal pain.  MOTHER HAD COLON CANCER AGE<60. MEDICATIONS: Promethazine (Phenergan) 12.5mg  IV, Demerol 150 mg IV, Demerol, and Versed 6 mg IV  DESCRIPTION OF PROCEDURE:    Physical exam was performed.  Informed consent was obtained from the patient after explaining the benefits, risks, and alternatives to procedure.  The patient was connected to monitor and placed in left lateral position. Continuous oxygen was provided by nasal cannula and IV medicine administered through an indwelling cannula.  After administration of sedation and rectal exam, the patients rectum was intubated and the EC-[id number] ([id number])  colonoscope was advanced under direct visualization to the ileum.  The scope was removed slowly by carefully examining the color, texture, anatomy, and integrity mucosa on the way out.  The patient was recovered in endoscopy and discharged home in satisfactory condition.    COLON FINDINGS: The mucosa appeared normal in the terminal ileum.  , Two sessile polyps measuring 3-4 mm in size were found in the sigmoid colon.  A polypectomy was performed with cold forceps.  , A normal appearing cecum, ileocecal valve, and appendiceal orifice were identified.  The ascending, hepatic flexure, transverse, splenic flexure, AND Descending, colon appeared unremarkable.  No cancers were seen.  Multiple biopsies were performed.  Small internal hemorrhoids were found.  PREP QUALITY: excellent.  CECAL W/D TIME: 18 minutes COMPLICATIONS: None  ENDOSCOPIC IMPRESSION: 1.   Normal mucosa  in the terminal ileum 2.   Two COLON polyps REMOVED 3.   Small internal hemorrhoids  RECOMMENDATIONS: USE DICYCLOMINE 10 MG TABLETS ONE OR TWO 30 MINUTES THREE TIMES A DAY 30 MINS PRIOR TO MEALS AND AT BEDTIME TO PREVENT DIARRHEA AND ABDOMINAL PAIN. FOLLOW A HIGH FIBER/LOW FAT DIET.  AVOID ITEMS THAT CAUSE BLOATING.   BIOPSY RESULTS SHOULD BE BACK IN 7 DAYS.  IF NO MICROSCOPIC COLITIS, PT NEEDS EGD/DUODENAL BIOPSIES WITH MAC AND/OR 24 HR STOOL COLLECTION TO CALCULATE OSMOTIC GAP.  FOLLOW UP IN 6 WEEKS. Next colonoscopy in 5 years with propofol.       _______________________________ eSignedBarney Drain, MD 07/31/2014 12:07 PM

## 2014-08-02 ENCOUNTER — Encounter (HOSPITAL_COMMUNITY): Payer: Self-pay | Admitting: Gastroenterology

## 2014-08-17 ENCOUNTER — Telehealth: Payer: Self-pay | Admitting: Gastroenterology

## 2014-08-17 DIAGNOSIS — R195 Other fecal abnormalities: Secondary | ICD-10-CM

## 2014-08-17 NOTE — Discharge Instructions (Signed)
THE LAST PART OF YOUR SMALL BOWEL IS NORMAL. NO SOURCE FOR YOUR LOOSE STOOLS WAS IDENTIFIED. You had 2 small polyps removed. You have TINY internal hemorrhoids. I BIOPSIED YOUR COLON.   YOU CAN USE DICYCLOMINE 10 MG TABLETS ONE OR TWO 30 MINUTES THREE TIMES A DAY 30 MINS PRIOR TO MEALS AND AT BEDTIME TO PREVENT DIARRHEA AND ABDOMINAL PAIN. IT MAY CAUSE DROWSINESS, DRY EYES/MOUTH, BLURRY VISION, OR DIFFICULTY URINATING.  FOLLOW A HIGH FIBER/LOW FAT DIET. AVOID ITEMS THAT CAUSE BLOATING. SEE INFO BELOW.  YOUR BIOPSY RESULTS SHOULD BE BACK IN 7 DAYS.  FOLLOW UP IN 6 WEEKS.  Next colonoscopy in 5 years.   Colonoscopy Care After Read the instructions outlined below and refer to this sheet in the next week. These discharge instructions provide you with general information on caring for yourself after you leave the hospital. While your treatment has been planned according to the most current medical practices available, unavoidable complications occasionally occur. If you have any problems or questions after discharge, call DR. Kinley Ferrentino, [phone].  ACTIVITY  You may resume your regular activity, but move at a slower pace for the next 24 hours.   Take frequent rest periods for the next 24 hours.   Walking will help get rid of the air and reduce the bloated feeling in your belly (abdomen).   No driving for 24 hours (because of the medicine (anesthesia) used during the test).   You may shower.   Do not sign any important legal documents or operate any machinery for 24 hours (because of the anesthesia used during the test).    NUTRITION  Drink plenty of fluids.   You may resume your normal diet as instructed by your doctor.   Begin with a light meal and progress to your normal diet. Heavy or fried foods are harder to digest and may make you feel sick to your stomach (nauseated).   Avoid alcoholic beverages for 24 hours or as instructed.    MEDICATIONS  You may resume your normal  medications.   WHAT YOU CAN EXPECT TODAY  Some feelings of bloating in the abdomen.   Passage of more gas than usual.   Spotting of blood in your stool or on the toilet paper  .  IF YOU HAD POLYPS REMOVED DURING THE COLONOSCOPY:  Eat a soft diet IF YOU HAVE NAUSEA, BLOATING, ABDOMINAL PAIN, OR VOMITING.    FINDING OUT THE RESULTS OF YOUR TEST Not all test results are available during your visit. DR. Oneida Alar WILL CALL YOU WITHIN 7 DAYS OF YOUR PROCEDUE WITH YOUR RESULTS. Do not assume everything is normal if you have not heard from DR. Katya Rolston IN ONE WEEK, CALL HER OFFICE AT [phone].  SEEK IMMEDIATE MEDICAL ATTENTION AND CALL THE OFFICE: [phone] IF:  You have more than a spotting of blood in your stool.   Your belly is swollen (abdominal distention).   You are nauseated or vomiting.   You have a temperature over 101F.   You have abdominal pain or discomfort that is severe or gets worse throughout the day.  Polyps, Colon  A polyp is extra tissue that grows inside your body. Colon polyps grow in the large intestine. The large intestine, also called the colon, is part of your digestive system. It is a long, hollow tube at the end of your digestive tract where your body makes and stores stool. Most polyps are not dangerous. They are benign. This means they are not cancerous. But over time,  some types of polyps can turn into cancer. Polyps that are smaller than a pea are usually not harmful. But larger polyps could someday become or may already be cancerous. To be safe, doctors remove all polyps and test them.   WHO GETS POLYPS? Anyone can get polyps, but certain people are more likely than others. You may have a greater chance of getting polyps if:  You are over 50.   You have had polyps before.   Someone in your family has had polyps.   Someone in your family has had cancer of the large intestine.   Find out if someone in your family has had polyps. You may also be  more likely to get polyps if you:   Eat a lot of fatty foods   Smoke   Drink alcohol   Do not exercise  Eat too much   TREATMENT  The caregiver will remove the polyp during sigmoidoscopy or colonoscopy.  PREVENTION There is not one sure way to prevent polyps. You might be able to lower your risk of getting them if you:  Eat more fruits and vegetables and less fatty food.   Do not smoke.   Avoid alcohol.   Exercise every day.   Lose weight if you are overweight.   Eating more calcium and folate can also lower your risk of getting polyps. Some foods that are rich in calcium are milk, cheese, and broccoli. Some foods that are rich in folate are chickpeas, kidney beans, and spinach.   High-Fiber Diet A high-fiber diet changes your normal diet to include more whole grains, legumes, fruits, and vegetables. Changes in the diet involve replacing refined carbohydrates with unrefined foods. The calorie level of the diet is essentially unchanged. The Dietary Reference Intake (recommended amount) for adult males is 38 grams per day. For adult females, it is 25 grams per day. Pregnant and lactating women should consume 28 grams of fiber per day. Fiber is the intact part of a plant that is not broken down during digestion. Functional fiber is fiber that has been isolated from the plant to provide a beneficial effect in the body. PURPOSE  Increase stool bulk.   Ease and regulate bowel movements.   Lower cholesterol.  INDICATIONS THAT YOU NEED MORE FIBER  Constipation and hemorrhoids.   Uncomplicated diverticulosis (intestine condition) and irritable bowel syndrome.   Weight management.   As a protective measure against hardening of the arteries (atherosclerosis), diabetes, and cancer.   GUIDELINES FOR INCREASING FIBER IN THE DIET  Start adding fiber to the diet slowly. A gradual increase of about 5 more grams (2 slices of whole-wheat bread, 2 servings of most fruits or vegetables, or  1 bowl of high-fiber cereal) per day is best. Too rapid an increase in fiber may result in constipation, flatulence, and bloating.   Drink enough water and fluids to keep your urine clear or pale yellow. Water, juice, or caffeine-free drinks are recommended. Not drinking enough fluid may cause constipation.   Eat a variety of high-fiber foods rather than one type of fiber.   Try to increase your intake of fiber through using high-fiber foods rather than fiber pills or supplements that contain small amounts of fiber.   The goal is to change the types of food eaten. Do not supplement your present diet with high-fiber foods, but replace foods in your present diet.  INCLUDE A VARIETY OF FIBER SOURCES  Replace refined and processed grains with whole grains, canned fruits with  fresh fruits, and incorporate other fiber sources. White rice, white breads, and most bakery goods contain little or no fiber.   Brown whole-grain rice, buckwheat oats, and many fruits and vegetables are all good sources of fiber. These include: broccoli, Brussels sprouts, cabbage, cauliflower, beets, sweet potatoes, white potatoes (skin on), carrots, tomatoes, eggplant, squash, berries, fresh fruits, and dried fruits.   Cereals appear to be the richest source of fiber. Cereal fiber is found in whole grains and bran. Bran is the fiber-rich outer coat of cereal grain, which is largely removed in refining. In whole-grain cereals, the bran remains. In breakfast cereals, the largest amount of fiber is found in those with "bran" in their names. The fiber content is sometimes indicated on the label.   You may need to include additional fruits and vegetables each day.   In baking, for 1 cup white flour, you may use the following substitutions:   1 cup whole-wheat flour minus 2 tablespoons.   1/2 cup white flour plus 1/2 cup whole-wheat flour.   Low-Fat Diet BREADS, CEREALS, PASTA, RICE, DRIED PEAS, AND BEANS These products are  high in carbohydrates and most are low in fat. Therefore, they can be increased in the diet as substitutes for fatty foods. They too, however, contain calories and should not be eaten in excess. Cereals can be eaten for snacks as well as for breakfast.   FRUITS AND VEGETABLES It is good to eat fruits and vegetables. Besides being sources of fiber, both are rich in vitamins and some minerals. They help you get the daily allowances of these nutrients. Fruits and vegetables can be used for snacks and desserts.  MEATS Limit lean meat, chicken, [place], and fish to no more than 6 ounces per day. Beef, Pork, and Lamb Use lean cuts of beef, pork, and lamb. Lean cuts include:  Extra-lean ground beef.  Arm roast.  Sirloin tip.  Center-cut ham.  Round steak.  Loin chops.  Rump roast.  Tenderloin.  Trim all fat off the outside of meats before cooking. It is not necessary to severely decrease the intake of red meat, but lean choices should be made. Lean meat is rich in protein and contains a highly absorbable form of iron. Premenopausal women, in particular, should avoid reducing lean red meat because this could increase the risk for low red blood cells (iron-deficiency anemia).  Chicken and [place] These are good sources of protein. The fat of poultry can be reduced by removing the skin and underlying fat layers before cooking. Chicken and [place] can be substituted for lean red meat in the diet. Poultry should not be fried or covered with high-fat sauces. Fish and Shellfish Fish is a good source of protein. Shellfish contain cholesterol, but they usually are low in saturated fatty acids. The preparation of fish is important. Like chicken and [place], they should not be fried or covered with high-fat sauces. EGGS Egg whites contain no fat or cholesterol. They can be eaten often. Try 1 to 2 egg whites instead of whole eggs in recipes or use egg substitutes that do not contain yolk. MILK AND DAIRY  PRODUCTS Use skim or 1% milk instead of 2% or whole milk. Decrease whole milk, natural, and processed cheeses. Use nonfat or low-fat (2%) cottage cheese or low-fat cheeses made from vegetable oils. Choose nonfat or low-fat (1 to 2%) yogurt. Experiment with evaporated skim milk in recipes that call for heavy cream. Substitute low-fat yogurt or low-fat cottage cheese for sour cream in  dips and salad dressings. Have at least 2 servings of low-fat dairy products, such as 2 glasses of skim (or 1%) milk each day to help get your daily calcium intake. FATS AND OILS Reduce the total intake of fats, especially saturated fat. Butterfat, lard, and beef fats are high in saturated fat and cholesterol. These should be avoided as much as possible. Vegetable fats do not contain cholesterol, but certain vegetable fats, such as coconut oil, palm oil, and palm kernel oil are very high in saturated fats. These should be limited. These fats are often used in bakery goods, processed foods, popcorn, oils, and nondairy creamers. Vegetable shortenings and some peanut butters contain hydrogenated oils, which are also saturated fats. Read the labels on these foods and check for saturated vegetable oils. Unsaturated vegetable oils and fats do not raise blood cholesterol. However, they should be limited because they are fats and are high in calories. Total fat should still be limited to 30% of your daily caloric intake. Desirable liquid vegetable oils are corn oil, cottonseed oil, olive oil, canola oil, safflower oil, soybean oil, and sunflower oil. Peanut oil is not as good, but small amounts are acceptable. Buy a heart-healthy tub margarine that has no partially hydrogenated oils in the ingredients. Mayonnaise and salad dressings often are made from unsaturated fats, but they should also be limited because of their high calorie and fat content. Seeds, nuts, peanut butter, olives, and avocados are high in fat, but the fat is mainly the  unsaturated type. These foods should be limited mainly to avoid excess calories and fat. OTHER EATING TIPS Snacks  Most sweets should be limited as snacks. They tend to be rich in calories and fats, and their caloric content outweighs their nutritional value. Some good choices in snacks are graham crackers, melba toast, soda crackers, bagels (no egg), English muffins, fruits, and vegetables. These snacks are preferable to snack crackers, [place] fries, TORTILLA CHIPS, and POTATO chips. Popcorn should be air-popped or cooked in small amounts of liquid vegetable oil. Desserts Eat fruit, low-fat yogurt, and fruit ices instead of pastries, cake, and cookies. Sherbet, angel food cake, gelatin dessert, frozen low-fat yogurt, or other frozen products that do not contain saturated fat (pure fruit juice bars, frozen ice pops) are also acceptable.  COOKING METHODS Choose those methods that use little or no fat. They include: Poaching.  Braising.  Steaming.  Grilling.  Baking.  Stir-frying.  Broiling.  Microwaving.  Foods can be cooked in a nonstick pan without added fat, or use a nonfat cooking spray in regular cookware. Limit fried foods and avoid frying in saturated fat. Add moisture to lean meats by using water, broth, cooking wines, and other nonfat or low-fat sauces along with the cooking methods mentioned above. Soups and stews should be chilled after cooking. The fat that forms on top after a few hours in the refrigerator should be skimmed off. When preparing meals, avoid using excess salt. Salt can contribute to raising blood pressure in some people.  EATING AWAY FROM HOME Order entres, potatoes, and vegetables without sauces or butter. When meat exceeds the size of a deck of cards (3 to 4 ounces), the rest can be taken home for another meal. Choose vegetable or fruit salads and ask for low-calorie salad dressings to be served on the side. Use dressings sparingly. Limit high-fat toppings, such as  bacon, crumbled eggs, cheese, sunflower seeds, and olives. Ask for heart-healthy tub margarine instead of butter.   Hemorrhoids Hemorrhoids are  dilated (enlarged) veins around the rectum. Sometimes clots will form in the veins. This makes them swollen and painful. These are called thrombosed hemorrhoids. Causes of hemorrhoids include:  Constipation.   Straining to have a bowel movement.   HEAVY LIFTING HOME CARE INSTRUCTIONS  Eat a well balanced diet and drink 6 to 8 glasses of water every day to avoid constipation. You may also use a bulk laxative.   Avoid straining to have bowel movements.   Keep anal area dry and clean.   Do not use a donut shaped pillow or sit on the toilet for long periods. This increases blood pooling and pain.   Move your bowels when your body has the urge; this will require less straining and will decrease pain and pressure.

## 2014-08-17 NOTE — Assessment & Plan Note (Signed)
STOOL STUDIES EGD DUO Bx

## 2014-08-17 NOTE — Telephone Encounter (Signed)
Please call pt. Her colon biopsies are normal. NO SOURCE FOR HER DIARRHEA WAS IDENTIFIED.  NEXT WEEK SHE NEEDS A 24 HR STOOL COLLECTION FOR NA, K, AND OSMOLALITY. SHE NEEDS AN EGD WITH DUODENAL BIOPSIES WITH PROPOFOL ON SEP 1.  SHE CAN USE DICYCLOMINE 10 MG TABLETS ONE OR TWO 30 MINUTES THREE TIMES A DAY 30 MINS PRIOR TO MEALS AND AT BEDTIME TO PREVENT DIARRHEA AND ABDOMINAL PAIN. IT MAY CAUSE DROWSINESS, DRY EYES/MOUTH, BLURRY VISION, OR DIFFICULTY URINATING.  FOLLOW A HIGH FIBER/LOW FAT DIET. AVOID ITEMS THAT CAUSE BLOATING.  FOLLOW UP IN OCT 2015 E30 DIARRHEA.  Next colonoscopy in 5 years WITH PROPOFOL.

## 2014-08-19 ENCOUNTER — Encounter (HOSPITAL_COMMUNITY): Payer: Self-pay | Admitting: Pharmacy Technician

## 2014-08-19 ENCOUNTER — Other Ambulatory Visit: Payer: Self-pay | Admitting: Gastroenterology

## 2014-08-19 NOTE — Telephone Encounter (Signed)
LMOM to call.

## 2014-08-19 NOTE — Telephone Encounter (Signed)
EGD w/Duodenal Bx w/Propofol is scheduled with [facility] on 09/01 I have mailed her the instructions and I have LMOM for her to return my call to confirm

## 2014-08-19 NOTE — Telephone Encounter (Signed)
Called and informed pt. Stool labs faxed to [facility] and pt is aware to pick up container for the stool tests there.  She will get special instructions from the lab on collection.  Routing to Darius Bump to schedule EGD.

## 2014-08-20 NOTE — Telephone Encounter (Signed)
Reminder in epic °

## 2014-08-21 ENCOUNTER — Encounter (HOSPITAL_COMMUNITY): Admission: RE | Admit: 2014-08-21 | Payer: Medicaid Other | Source: Ambulatory Visit

## 2014-08-21 NOTE — Patient Instructions (Signed)
ANGLINE SCHWEIGERT  08/21/2014   Your procedure is scheduled on:   08/27/2014  Report to [facility] at  800  AM.  Call this number if you have problems the morning of surgery: [phone]   Remember:   Do not eat food or drink liquids after midnight.   Take these medicines the morning of surgery with A SIP OF WATER: zolft, zofran, robaxin, norco, bentyl,xanax   Do not wear jewelry, make-up or nail polish.  Do not wear lotions, powders, or perfumes.   Do not shave 48 hours prior to surgery. Men may shave face and neck.  Do not bring valuables to the hospital.  [facility] is not responsible for any belongings or valuables.               Contacts, dentures or bridgework may not be worn into surgery.  Leave suitcase in the car. After surgery it may be brought to your room.  For patients admitted to the hospital, discharge time is determined by your treatment team.               Patients discharged the day of surgery will not be allowed to drive home.  Name and phone number of your driver: family  Special Instructions: N/A   Please read over the following fact sheets that you were given: Pain Booklet, Coughing and Deep Breathing, Surgical Site Infection Prevention, Anesthesia Post-op Instructions and Care and Recovery After Surgery Esophagogastroduodenoscopy Esophagogastroduodenoscopy (EGD) is a procedure to examine the lining of the esophagus, stomach, and first part of the small intestine (duodenum). A long, flexible, lighted tube with a camera attached (endoscope) is inserted down the throat to view these organs. This procedure is done to detect problems or abnormalities, such as inflammation, bleeding, ulcers, or growths, in order to treat them. The procedure lasts about 5-20 minutes. It is usually an outpatient procedure, but it may need to be performed in emergency cases in the hospital. LET YOUR CAREGIVER KNOW ABOUT:   Allergies to food or medicine.  All medicines you are  taking, including vitamins, herbs, eyedrops, and over-the-counter medicines and creams.  Use of steroids (by mouth or creams).  Previous problems you or members of your family have had with the use of anesthetics.  Any blood disorders you have.  Previous surgeries you have had.  Other health problems you have.  Possibility of pregnancy, if this applies. RISKS AND COMPLICATIONS  Generally, EGD is a safe procedure. However, as with any procedure, complications can occur. Possible complications include:  Infection.  Bleeding.  Tearing (perforation) of the esophagus, stomach, or duodenum.  Difficulty breathing or not being able to breath.  Excessive sweating.  Spasms of the larynx.  Slowed heartbeat.  Low blood pressure. BEFORE THE PROCEDURE  Do not eat or drink anything for 6-8 hours before the procedure or as directed by your caregiver.  Ask your caregiver about changing or stopping your regular medicines.  If you wear dentures, be prepared to remove them before the procedure.  Arrange for someone to drive you home after the procedure. PROCEDURE   A vein will be accessed to give medicines and fluids. A medicine to relax you (sedative) and a pain reliever will be given through that access into the vein.  A numbing medicine (local anesthetic) may be sprayed on your throat for comfort and to stop you from gagging or coughing.  A mouth guard may be placed in your mouth to  protect your teeth and to keep you from biting on the endoscope.  You will be asked to lie on your left side.  The endoscope is inserted down your throat and into the esophagus, stomach, and duodenum.  Air is put through the endoscope to allow your caregiver to view the lining of your esophagus clearly.  The esophagus, stomach, and duodenum is then examined. During the exam, your caregiver may:  Remove tissue to be examined under a microscope (biopsy) for inflammation, infection, or other medical  problems.  Remove growths.  Remove objects (foreign bodies) that are stuck.  Treat any bleeding with medicines or other devices that stop tissues from bleeding (hot cautery, clipping devices).  Widen (dilate) or stretch narrowed areas of the esophagus and stomach.  The endoscope will then be withdrawn. AFTER THE PROCEDURE  You will be taken to a recovery area to be monitored. You will be able to go home once you are stable and alert.  Do not eat or drink anything until the local anesthetic and numbing medicines have worn off. You may choke.  It is normal to feel bloated, have pain with swallowing, or have a sore throat for a short time. This will wear off.  Your caregiver should be able to discuss his or her findings with you. It will take longer to discuss the test results if any biopsies were taken. Document Released: 04/15/2005 Document Revised: 04/29/2014 Document Reviewed: 11/15/2012 [facility] Patient Information 2015 [place], [place]. This information is not intended to replace advice given to you by your health care provider. Make sure you discuss any questions you have with your health care provider. PATIENT INSTRUCTIONS POST-ANESTHESIA  IMMEDIATELY FOLLOWING SURGERY:  Do not drive or operate machinery for the first twenty four hours after surgery.  Do not make any important decisions for twenty four hours after surgery or while taking narcotic pain medications or sedatives.  If you develop intractable nausea and vomiting or a severe headache please notify your doctor immediately.  FOLLOW-UP:  Please make an appointment with your surgeon as instructed. You do not need to follow up with anesthesia unless specifically instructed to do so.  WOUND CARE INSTRUCTIONS (if applicable):  Keep a dry clean dressing on the anesthesia/puncture wound site if there is drainage.  Once the wound has quit draining you may leave it open to air.  Generally you should leave the bandage intact for  twenty four hours unless there is drainage.  If the epidural site drains for more than 36-48 hours please call the anesthesia department.  QUESTIONS?:  Please feel free to call your physician or the hospital operator if you have any questions, and they will be happy to assist you.

## 2014-08-22 ENCOUNTER — Encounter (HOSPITAL_COMMUNITY): Payer: Self-pay

## 2014-08-22 ENCOUNTER — Encounter (HOSPITAL_COMMUNITY)
Admission: RE | Admit: 2014-08-22 | Discharge: 2014-08-22 | Disposition: A | Discharge status: Home / Self Care | Payer: Medicaid Other | Source: Ambulatory Visit | Attending: Gastroenterology | Admitting: Gastroenterology

## 2014-08-22 HISTORY — DX: Gastro-esophageal reflux disease without esophagitis: K21.9

## 2014-08-27 ENCOUNTER — Encounter (HOSPITAL_COMMUNITY)
Admission: RE | Disposition: A | Discharge status: Home / Self Care | Payer: Self-pay | Source: Ambulatory Visit | Attending: Gastroenterology

## 2014-08-27 ENCOUNTER — Ambulatory Visit (HOSPITAL_COMMUNITY): Payer: Medicaid Other | Admitting: Anesthesiology

## 2014-08-27 ENCOUNTER — Encounter (HOSPITAL_COMMUNITY): Payer: Self-pay | Admitting: *Deleted

## 2014-08-27 ENCOUNTER — Encounter (HOSPITAL_COMMUNITY): Payer: Medicaid Other | Admitting: Anesthesiology

## 2014-08-27 ENCOUNTER — Ambulatory Visit (HOSPITAL_COMMUNITY)
Admission: RE | Admit: 2014-08-27 | Discharge: 2014-08-27 | Disposition: A | Discharge status: Home / Self Care | Payer: Medicaid Other | Source: Ambulatory Visit | Attending: Gastroenterology | Admitting: Gastroenterology

## 2014-08-27 DIAGNOSIS — R197 Diarrhea, unspecified: Secondary | ICD-10-CM

## 2014-08-27 DIAGNOSIS — K2940 Chronic atrophic gastritis without bleeding: Secondary | ICD-10-CM | POA: Insufficient documentation

## 2014-08-27 DIAGNOSIS — F17200 Nicotine dependence, unspecified, uncomplicated: Secondary | ICD-10-CM | POA: Insufficient documentation

## 2014-08-27 DIAGNOSIS — F341 Dysthymic disorder: Secondary | ICD-10-CM | POA: Insufficient documentation

## 2014-08-27 DIAGNOSIS — K449 Diaphragmatic hernia without obstruction or gangrene: Secondary | ICD-10-CM | POA: Insufficient documentation

## 2014-08-27 DIAGNOSIS — R195 Other fecal abnormalities: Secondary | ICD-10-CM

## 2014-08-27 HISTORY — PX: BIOPSY: SHX5522

## 2014-08-27 HISTORY — PX: ESOPHAGOGASTRODUODENOSCOPY (EGD) WITH PROPOFOL: SHX5813

## 2014-08-27 SURGERY — ESOPHAGOGASTRODUODENOSCOPY (EGD) WITH PROPOFOL
Anesthesia: Monitor Anesthesia Care

## 2014-08-27 MED ORDER — MIDAZOLAM HCL 2 MG/2ML IJ SOLN
INTRAMUSCULAR | Status: AC
  Filled 2014-08-27: qty 2

## 2014-08-27 MED ORDER — FENTANYL CITRATE 0.05 MG/ML IJ SOLN: 25.0000 ug | INTRAMUSCULAR | Status: DC | PRN

## 2014-08-27 MED ORDER — LIDOCAINE HCL (PF) 1 % IJ SOLN
INTRAMUSCULAR | Status: AC
  Filled 2014-08-27: qty 5

## 2014-08-27 MED ORDER — LACTATED RINGERS IV SOLN
INTRAVENOUS | Status: DC
  Administered 2014-08-27: 09:00:00 via INTRAVENOUS

## 2014-08-27 MED ORDER — ONDANSETRON HCL 4 MG/2ML IJ SOLN
INTRAMUSCULAR | Status: AC
  Filled 2014-08-27: qty 2

## 2014-08-27 MED ORDER — FENTANYL CITRATE 0.05 MG/ML IJ SOLN
INTRAMUSCULAR | Status: DC | PRN
  Administered 2014-08-27 (×2): 50 ug via INTRAVENOUS

## 2014-08-27 MED ORDER — PROPOFOL INFUSION 10 MG/ML OPTIME
INTRAVENOUS | Status: DC | PRN
  Administered 2014-08-27: 75 ug/kg/min via INTRAVENOUS

## 2014-08-27 MED ORDER — LIDOCAINE VISCOUS 2 % MT SOLN
15.0000 mL | Freq: Once | OROMUCOSAL | Status: DC
  Filled 2014-08-27: qty 15

## 2014-08-27 MED ORDER — FENTANYL CITRATE 0.05 MG/ML IJ SOLN
INTRAMUSCULAR | Status: AC
  Filled 2014-08-27: qty 2

## 2014-08-27 MED ORDER — LACTATED RINGERS IV SOLN
INTRAVENOUS | Status: DC | PRN
  Administered 2014-08-27: 08:00:00 via INTRAVENOUS

## 2014-08-27 MED ORDER — FENTANYL CITRATE 0.05 MG/ML IJ SOLN
25.0000 ug | INTRAMUSCULAR | Status: AC
  Administered 2014-08-27 (×2): 25 ug via INTRAVENOUS

## 2014-08-27 MED ORDER — ONDANSETRON HCL 4 MG/2ML IJ SOLN
4.0000 mg | Freq: Once | INTRAMUSCULAR | Status: AC
  Administered 2014-08-27: 4 mg via INTRAVENOUS

## 2014-08-27 MED ORDER — MIDAZOLAM HCL 2 MG/2ML IJ SOLN
1.0000 mg | INTRAMUSCULAR | Status: DC | PRN
  Administered 2014-08-27: 2 mg via INTRAVENOUS

## 2014-08-27 MED ORDER — PROPOFOL 10 MG/ML IV BOLUS
INTRAVENOUS | Status: AC
  Filled 2014-08-27: qty 20

## 2014-08-27 MED ORDER — FENTANYL CITRATE 0.05 MG/ML IJ SOLN
INTRAMUSCULAR | Status: AC
Start: 2014-08-27 — End: 2014-08-27
  Filled 2014-08-27: qty 2

## 2014-08-27 MED ORDER — LIDOCAINE HCL (CARDIAC) 10 MG/ML IV SOLN
INTRAVENOUS | Status: DC | PRN
  Administered 2014-08-27: 50 mg via INTRAVENOUS

## 2014-08-27 MED ORDER — LIDOCAINE VISCOUS 2 % MT SOLN
OROMUCOSAL | Status: AC
  Administered 2014-08-27: 4 mL
  Filled 2014-08-27: qty 15

## 2014-08-27 MED ORDER — GLYCOPYRROLATE 0.2 MG/ML IJ SOLN
0.2000 mg | Freq: Once | INTRAMUSCULAR | Status: AC
  Administered 2014-08-27: 0.2 mg via INTRAVENOUS

## 2014-08-27 MED ORDER — ONDANSETRON HCL 4 MG/2ML IJ SOLN: 4.0000 mg | Freq: Once | INTRAMUSCULAR | Status: DC | PRN

## 2014-08-27 MED ORDER — GLYCOPYRROLATE 0.2 MG/ML IJ SOLN
INTRAMUSCULAR | Status: AC
  Filled 2014-08-27: qty 1

## 2014-08-27 MED ORDER — OMEPRAZOLE 20 MG PO CPDR: DELAYED_RELEASE_CAPSULE | ORAL | Status: DC

## 2014-08-27 MED ORDER — MIDAZOLAM HCL 5 MG/5ML IJ SOLN
INTRAMUSCULAR | Status: DC | PRN
  Administered 2014-08-27 (×2): 1 mg via INTRAVENOUS

## 2014-08-27 SURGICAL SUPPLY — 18 items
BLOCK BITE 60FR ADLT L/F BLUE (MISCELLANEOUS) ×2 IMPLANT
ELECT REM PT RETURN 9FT ADLT (ELECTROSURGICAL)
ELECTRODE REM PT RTRN 9FT ADLT (ELECTROSURGICAL) IMPLANT
FLOOR PAD 36X40 (MISCELLANEOUS) ×2
FORCEPS BIOP RAD 4 LRG CAP 4 (CUTTING FORCEPS) ×2 IMPLANT
FORMALIN 10 PREFIL 20ML (MISCELLANEOUS) ×4 IMPLANT
KIT CLEAN ENDO COMPLIANCE (KITS) ×2 IMPLANT
MANIFOLD NEPTUNE II (INSTRUMENTS) ×2 IMPLANT
NEEDLE SCLEROTHERAPY 25GX240 (NEEDLE) IMPLANT
PAD FLOOR 36X40 (MISCELLANEOUS) ×1 IMPLANT
PROBE APC STR FIRE (PROBE) IMPLANT
PROBE INJECTION GOLD (MISCELLANEOUS)
PROBE INJECTION GOLD 7FR (MISCELLANEOUS) IMPLANT
SNARE SHORT THROW 13M SML OVAL (MISCELLANEOUS) IMPLANT
SYR 50ML LL SCALE MARK (SYRINGE) ×2 IMPLANT
SYR INFLATION 60ML (SYRINGE) IMPLANT
TUBING IRRIGATION ENDOGATOR (MISCELLANEOUS) ×2 IMPLANT
WATER STERILE IRR 1000ML POUR (IV SOLUTION) ×2 IMPLANT

## 2014-08-27 NOTE — Transfer of Care (Signed)
Immediate Anesthesia Transfer of Care Note  Patient: [name]  Procedure(s) Performed: Procedure(s) with comments: ESOPHAGOGASTRODUODENOSCOPY (EGD) WITH PROPOFOL (N/A) - 9:30 BIOPSY (N/A) - DUODENAL AND GASTRIC  Patient Location: PACU  Anesthesia Type:MAC  Level of Consciousness: awake, alert , oriented and patient cooperative  Airway & Oxygen Therapy: Patient Spontanous Breathing  Post-op Assessment: Report given to PACU RN and Post -op Vital signs reviewed and stable  Post vital signs: Reviewed and stable  Complications: No apparent anesthesia complications

## 2014-08-27 NOTE — Discharge Instructions (Signed)
You have mild gastritis. I biopsied your stomach AND DUODENUM.   Complete STOOL COLLECTION.  TAKE OMEPRAZOLE 30 MINUTES PRIOR TO MEALS DAILY.  CONTINUE BENTYL.  AVOID TRIGGERS FOR GASTRITIS. SEE INFO BELOW.  FOLLOW A LOW FAT DIET. SEE INFO BELOW.  YOUR BIOPSY WILL BE BACK IN 14 DAYS.  FOLLOW UP IN 4 MOS.   UPPER ENDOSCOPY AFTER CARE Read the instructions outlined below and refer to this sheet in the next week. These discharge instructions provide you with general information on caring for yourself after you leave the hospital. While your treatment has been planned according to the most current medical practices available, unavoidable complications occasionally occur. If you have any problems or questions after discharge, call DR. Harjit Leider, [phone].  ACTIVITY  You may resume your regular activity, but move at a slower pace for the next 24 hours.   Take frequent rest periods for the next 24 hours.   Walking will help get rid of the air and reduce the bloated feeling in your belly (abdomen).   No driving for 24 hours (because of the medicine (anesthesia) used during the test).   You may shower.   Do not sign any important legal documents or operate any machinery for 24 hours (because of the anesthesia used during the test).    NUTRITION  Drink plenty of fluids.   You may resume your normal diet as instructed by your doctor.   Begin with a light meal and progress to your normal diet. Heavy or fried foods are harder to digest and may make you feel sick to your stomach (nauseated).   Avoid alcoholic beverages for 24 hours or as instructed.    MEDICATIONS  You may resume your normal medications.   WHAT YOU CAN EXPECT TODAY  Some feelings of bloating in the abdomen.   Passage of more gas than usual.    IF YOU HAD A BIOPSY TAKEN DURING THE UPPER ENDOSCOPY:  Eat a soft diet IF YOU HAVE NAUSEA, BLOATING, ABDOMINAL PAIN, OR VOMITING.    FINDING OUT THE RESULTS  OF YOUR TEST Not all test results are available during your visit. DR. Oneida Alar WILL CALL YOU WITHIN 7 DAYS OF YOUR PROCEDUE WITH YOUR RESULTS. Do not assume everything is normal if you have not heard from DR. Jorryn Hershberger IN ONE WEEK, CALL HER OFFICE AT [phone].  SEEK IMMEDIATE MEDICAL ATTENTION AND CALL THE OFFICE: [phone] IF:  You have more than a spotting of blood in your stool.   Your belly is swollen (abdominal distention).   You are nauseated or vomiting.   You have a temperature over 101F.   You have abdominal pain or discomfort that is severe or gets worse throughout the day.   Gastritis  Gastritis is an inflammation (the body's way of reacting to injury and/or infection) of the stomach. It is often caused by viral or bacterial (germ) infections. It can also be caused BY ASPIRIN, BC/GOODY POWDER'S, (IBUPROFEN) MOTRIN, OR ALEVE (NAPROXEN), chemicals (including alcohol), SPICY FOODS, and medications. This illness may be associated with generalized malaise (feeling tired, not well), UPPER ABDOMINAL STOMACH cramps, and fever. One common bacterial cause of gastritis is an organism known as H. Pylori. This can be treated with antibiotics.    Low-Fat Diet  BREADS, CEREALS, PASTA, RICE, DRIED PEAS, AND BEANS These products are high in carbohydrates and most are low in fat. Therefore, they can be increased in the diet as substitutes for fatty foods. They too, however, contain calories and should  not be eaten in excess. Cereals can be eaten for snacks as well as for breakfast.  Include foods that contain fiber (fruits, vegetables, whole grains, and legumes). Research shows that fiber may lower blood cholesterol levels, especially the water-soluble fiber found in fruits, vegetables, oat products, and legumes.  FRUITS AND VEGETABLES It is good to eat fruits and vegetables. Besides being sources of fiber, both are rich in vitamins and some minerals. They help you get the daily allowances of  these nutrients. Fruits and vegetables can be used for snacks and desserts.  MEATS Limit lean meat, chicken, [place], and fish to no more than 6 ounces per day.  Beef, Pork, and Lamb Use lean cuts of beef, pork, and lamb. Lean cuts include:  Extra-lean ground beef.  Arm roast.  Sirloin tip.  Center-cut ham.  Round steak.  Loin chops.  Rump roast.  Tenderloin.  Trim all fat off the outside of meats before cooking. It is not necessary to severely decrease the intake of red meat, but lean choices should be made. Lean meat is rich in protein and contains a highly absorbable form of iron. Premenopausal women, in particular, should avoid reducing lean red meat because this could increase the risk for low red blood cells (iron-deficiency anemia).  Chicken and [place] These are good sources of protein. The fat of poultry can be reduced by removing the skin and underlying fat layers before cooking. Chicken and [place] can be substituted for lean red meat in the diet. Poultry should not be fried or covered with high-fat sauces.  Fish and Shellfish Fish is a good source of protein. Shellfish contain cholesterol, but they usually are low in saturated fatty acids. The preparation of fish is important. Like chicken and [place], they should not be fried or covered with high-fat sauces.  EGGS Egg whites contain no fat or cholesterol. They can be eaten often. Try 1 to 2 egg whites instead of whole eggs in recipes or use egg substitutes that do not contain yolk.  MILK AND DAIRY PRODUCTS Use skim or 1% milk instead of 2% or whole milk. Decrease whole milk, natural, and processed cheeses. Use nonfat or low-fat (2%) cottage cheese or low-fat cheeses made from vegetable oils. Choose nonfat or low-fat (1 to 2%) yogurt. Experiment with evaporated skim milk in recipes that call for heavy cream. Substitute low-fat yogurt or low-fat cottage cheese for sour cream in dips and salad dressings. Have at least 2 servings of  low-fat dairy products, such as 2 glasses of skim (or 1%) milk each day to help get your daily calcium intake.  FATS AND OILS Reduce the total intake of fats, especially saturated fat. Butterfat, lard, and beef fats are high in saturated fat and cholesterol. These should be avoided as much as possible. Vegetable fats do not contain cholesterol, but certain vegetable fats, such as coconut oil, palm oil, and palm kernel oil are very high in saturated fats. These should be limited. These fats are often used in bakery goods, processed foods, popcorn, oils, and nondairy creamers. Vegetable shortenings and some peanut butters contain hydrogenated oils, which are also saturated fats. Read the labels on these foods and check for saturated vegetable oils.  Unsaturated vegetable oils and fats do not raise blood cholesterol. However, they should be limited because they are fats and are high in calories. Total fat should still be limited to 30% of your daily caloric intake. Desirable liquid vegetable oils are corn oil, cottonseed oil, olive oil, canola  oil, safflower oil, soybean oil, and sunflower oil. Peanut oil is not as good, but small amounts are acceptable. Buy a heart-healthy tub margarine that has no partially hydrogenated oils in the ingredients. Mayonnaise and salad dressings often are made from unsaturated fats, but they should also be limited because of their high calorie and fat content. Seeds, nuts, peanut butter, olives, and avocados are high in fat, but the fat is mainly the unsaturated type. These foods should be limited mainly to avoid excess calories and fat.  OTHER EATING TIPS Snacks  Most sweets should be limited as snacks. They tend to be rich in calories and fats, and their caloric content outweighs their nutritional value. Some good choices in snacks are graham crackers, melba toast, soda crackers, bagels (no egg), English muffins, fruits, and vegetables. These snacks are preferable to snack  crackers, [place] fries, and chips. Popcorn should be air-popped or cooked in small amounts of liquid vegetable oil.  Desserts Eat fruit, low-fat yogurt, and fruit ices instead of pastries, cake, and cookies. Sherbet, angel food cake, gelatin dessert, frozen low-fat yogurt, or other frozen products that do not contain saturated fat (pure fruit juice bars, frozen ice pops) are also acceptable.   COOKING METHODS Choose those methods that use little or no fat. They include: Poaching.  Braising.  Steaming.  Grilling.  Baking.  Stir-frying.  Broiling.  Microwaving.  Foods can be cooked in a nonstick pan without added fat, or use a nonfat cooking spray in regular cookware. Limit fried foods and avoid frying in saturated fat. Add moisture to lean meats by using water, broth, cooking wines, and other nonfat or low-fat sauces along with the cooking methods mentioned above. Soups and stews should be chilled after cooking. The fat that forms on top after a few hours in the refrigerator should be skimmed off. When preparing meals, avoid using excess salt. Salt can contribute to raising blood pressure in some people.  EATING AWAY FROM HOME Order entres, potatoes, and vegetables without sauces or butter. When meat exceeds the size of a deck of cards (3 to 4 ounces), the rest can be taken home for another meal. Choose vegetable or fruit salads and ask for low-calorie salad dressings to be served on the side. Use dressings sparingly. Limit high-fat toppings, such as bacon, crumbled eggs, cheese, sunflower seeds, and olives. Ask for heart-healthy tub margarine instead of butter.

## 2014-08-27 NOTE — Anesthesia Postprocedure Evaluation (Signed)
  Anesthesia Post-op Note  Patient: [name]  Procedure(s) Performed: Procedure(s) with comments: ESOPHAGOGASTRODUODENOSCOPY (EGD) WITH PROPOFOL (N/A) - 9:30 BIOPSY (N/A) - DUODENAL AND GASTRIC  Patient Location: PACU  Anesthesia Type:MAC  Level of Consciousness: awake, alert , oriented and patient cooperative  Airway and Oxygen Therapy: Patient Spontanous Breathing  Post-op Pain: none  Post-op Assessment: Post-op Vital signs reviewed, Patient's Cardiovascular Status Stable, Respiratory Function Stable, Patent Airway, No signs of Nausea or vomiting and Pain level controlled  Post-op Vital Signs: Reviewed and stable  Last Vitals:  Filed Vitals:   08/27/14 0855  BP:   Pulse:   Temp:   Resp: 29    Complications: No apparent anesthesia complications

## 2014-08-27 NOTE — Anesthesia Preprocedure Evaluation (Addendum)
Anesthesia Evaluation  Patient identified by MRN, date of birth, ID band Patient awake    Reviewed: Allergy & Precautions, H&P , NPO status , Patient's Chart, lab work & pertinent test results  Airway Mallampati: I TM Distance: >3 FB     Dental  (+) Teeth Intact, Missing   Pulmonary Current Smoker,  breath sounds clear to auscultation        Cardiovascular negative cardio ROS  Rhythm:Regular Rate:Normal     Neuro/Psych PSYCHIATRIC DISORDERS Anxiety Depression    GI/Hepatic GERD-  Medicated and Poorly Controlled,  Endo/Other    Renal/GU      Musculoskeletal   Abdominal   Peds  Hematology   Anesthesia Other Findings   Reproductive/Obstetrics                          Anesthesia Physical Anesthesia Plan  ASA: II  Anesthesia Plan: MAC   Post-op Pain Management:    Induction: Intravenous  Airway Management Planned: Simple Face Mask  Additional Equipment:   Intra-op Plan:   Post-operative Plan:   Informed Consent: I have reviewed the patients History and Physical, chart, labs and discussed the procedure including the risks, benefits and alternatives for the proposed anesthesia with the patient or authorized representative who has indicated his/her understanding and acceptance.     Plan Discussed with:   Anesthesia Plan Comments:         Anesthesia Quick Evaluation

## 2014-08-27 NOTE — Interval H&P Note (Signed)
History and Physical Interval Note:  08/27/2014 8:15 AM  [name]  has presented today for surgery, with the diagnosis of DIARRHEA  The various methods of treatment have been discussed with the patient and family. After consideration of risks, benefits and other options for treatment, the patient has consented to  Procedure(s) with comments: ESOPHAGOGASTRODUODENOSCOPY (EGD) WITH PROPOFOL (N/A) - 9:30 BIOPSY (N/A) - DUODENAL BX as a surgical intervention .  The patient's history has been reviewed, patient examined, no change in status, stable for surgery.  I have reviewed the patient's chart and labs.  Questions were answered to the patient's satisfaction.     [employer]

## 2014-08-27 NOTE — H&P (View-Only) (Signed)
Primary Care Physician:  Alonza Bogus, MD Primary Gastroenterologist:  Dr. Oneida Alar  Pre-Procedure History & Physical:  HPI:  [name] is a 38 y.o. female here for  DIARRHEA/ABDOMINAL PAIN/?brbpr.   Past Medical History  Diagnosis Date  . Depression with anxiety   . Acute ear infection     Past Surgical History  Procedure Laterality Date  . Tubal ligation    . Endometrial ablation      Prior to Admission medications   Medication Sig Start Date End Date Taking? Authorizing Provider  ALPRAZolam Duanne Moron) 1 MG tablet Take 1 mg by mouth 3 (three) times daily.   Yes Historical Provider, MD  cefdinir (OMNICEF) 300 MG capsule Take 300 mg by mouth 2 (two) times daily.   Yes Historical Provider, MD  dicyclomine (BENTYL) 10 MG capsule Take 1 capsule (10 mg total) by mouth 4 (four) times daily -  before meals and at bedtime. 07/18/14  Yes Orvil Feil, NP  HYDROcodone-acetaminophen (NORCO) 10-325 MG per tablet Take 1 tablet by mouth every 4 (four) hours as needed for moderate pain.    Yes Historical Provider, MD  methocarbamol (ROBAXIN) 500 MG tablet Take 500 mg by mouth 3 (three) times daily.   Yes Historical Provider, MD  ondansetron (ZOFRAN ODT) 4 MG disintegrating tablet Take 1 tablet (4 mg total) by mouth every 8 (eight) hours as needed for nausea or vomiting. 07/18/14  Yes Orvil Feil, NP  peg 3350 powder (MOVIPREP) 100 G SOLR Take 1 kit (200 g total) by mouth as directed. 07/18/14  Yes Danie Binder, MD  Probiotic Product (ALIGN) 4 MG CAPS Take 1 capsule by mouth daily.   Yes Historical Provider, MD  sertraline (ZOLOFT) 50 MG tablet Take 50 mg by mouth daily.   Yes Historical Provider, MD  zolpidem (AMBIEN) 10 MG tablet Take 10 mg by mouth at bedtime.   Yes Historical Provider, MD    Allergies as of 07/18/2014  . (No Known Allergies)    Family History  Problem Relation Age of Onset  . Celiac disease Neg Hx   . Crohn's disease Neg Hx   . Ulcerative colitis Neg Hx   . Colon  cancer Mother     History   Social History  . Marital Status: Divorced    Spouse Name: N/A    Number of Children: N/A  . Years of Education: N/A   Occupational History  . [employer]     [place]   Social History Main Topics  . Smoking status: Current Every Day Smoker -- 1.00 packs/day for 22 years    Types: Cigarettes  . Smokeless tobacco: Not on file     Comment: less than a pack  . Alcohol Use: No  . Drug Use: No  . Sexual Activity: Yes   Other Topics Concern  . Not on file   Social History Narrative  . No narrative on file    Review of Systems: See HPI, otherwise negative ROS   Physical Exam: BP 123/72  Pulse 77  Temp(Src) 98 F (36.7 C) (Oral)  Resp 22  Ht '5\' 4"'  (1.626 m)  Wt 128 lb (58.06 kg)  BMI 21.96 kg/m2  SpO2 100% General:   Alert,  pleasant and cooperative in NAD Head:  Normocephalic and atraumatic. Neck:  Supple; Lungs:  Clear throughout to auscultation.    Heart:  Regular rate and rhythm. Abdomen:  Soft, nontender and nondistended. Normal bowel sounds, without guarding, and without rebound.  Neurologic:  Alert and  oriented x4;  grossly normal neurologically.  Impression/Plan:     Diarrhea/BRBPR  PLAN: TCS TODAY WITH BIOPSY

## 2014-08-28 ENCOUNTER — Encounter (HOSPITAL_COMMUNITY): Payer: Self-pay | Admitting: Gastroenterology

## 2014-08-28 NOTE — Op Note (Signed)
[facility] [address] [place], [address]   ENDOSCOPY PROCEDURE REPORT  PATIENT: [name], [name]  MR#: [id number] BIRTHDATE: [birth year] , 38  yrs. old GENDER: Female  ENDOSCOPIST: Barney Drain, MD REFERRED LE:XNTZGY Luan Pulling, M.D.  PROCEDURE DATE: 08/27/2014 PROCEDURE:   EGD w/ biopsy  INDICATIONS:Unexplained diarrhea. MEDICATIONS: MAC sedation, administered by CRNA TOPICAL ANESTHETIC:   Viscous Xylocaine  DESCRIPTION OF PROCEDURE:     Physical exam was performed.  Informed consent was obtained from the patient after explaining the benefits, risks, and alternatives to the procedure.  The patient was connected to the monitor and placed in the left lateral position.  Continuous oxygen was provided by nasal cannula and IV medicine administered through an indwelling cannula.  After administration of sedation, the patients esophagus was intubated and the     endoscope was advanced under direct visualization to the second portion of the duodenum.  The scope was removed slowly by carefully examining the color, texture, anatomy, and integrity of the mucosa on the way out.  The patient was recovered in endoscopy and discharged home in satisfactory condition.   ESOPHAGUS: The mucosa of the esophagus appeared normal.   A small hiatal hernia was noted.   STOMACH: Mild non-erosive gastritis (inflammation) was found in the gastric antrum and gastric body. Multiple biopsies were performed using cold forceps.   DUODENUM: The duodenal mucosa showed no abnormalities in the bulb and second portion of the duodenum.  Cold forceps biopsies were taken in the bulb and second portion. COMPLICATIONS:   None  ENDOSCOPIC IMPRESSION: 1.   NO OBVIOUS SOURCE FOR DIARRHEA IDENTIFIED 2.   Small hiatal hernia 3.   MILD Non-erosive gastritis  RECOMMENDATIONS: Complete 24 HR STOOL COLLECTION. TAKE OMEPRAZOLE 30 MINUTES PRIOR TO MEALS DAILY. CONTINUE BENTYL. AVOID TRIGGERS FOR  GASTRITIS. FOLLOW A LOW FAT DIET. BIOPSY WILL BE BACK IN 14 DAYS.  FOLLOW UP IN 4 MOS.   REPEAT EXAM:   _______________________________ Lorrin MaisBarney Drain, MD 08/28/2014 11:54 AM       PATIENT NAME:  [name] MR#: [id number]

## 2014-09-05 ENCOUNTER — Telehealth: Payer: Self-pay | Admitting: Gastroenterology

## 2014-09-05 NOTE — Telephone Encounter (Signed)
Unable to reach pt by phone. Mobile not working. Mailed letter to call for results.

## 2014-09-05 NOTE — Telephone Encounter (Signed)
PATIENT NIC'D  °

## 2014-09-05 NOTE — Telephone Encounter (Signed)
Please call pt. HER stomach Bx shows gastritis.  HER SMALL BOWEL BIOPSIES ARE NORMAL.  Complete 24 HR STOOL COLLECTION. TAKE OMEPRAZOLE 30 MINUTES PRIOR TO MEALS DAILY.  CONTINUE BENTYL. AVOID TRIGGERS FOR GASTRITIS.  FOLLOW A LOW FAT DIET.   FOLLOW UP IN OCT 2015 E30 DIARRHEA.

## 2014-09-11 ENCOUNTER — Encounter: Payer: Self-pay | Admitting: Gastroenterology

## 2014-09-11 ENCOUNTER — Ambulatory Visit (INDEPENDENT_AMBULATORY_CARE_PROVIDER_SITE_OTHER): Payer: Medicaid Other | Admitting: Gastroenterology

## 2014-09-11 VITALS — BP 131/85 | HR 85 | Temp 97.2°F | Ht 62.0 in | Wt 131.8 lb

## 2014-09-11 DIAGNOSIS — R195 Other fecal abnormalities: Secondary | ICD-10-CM

## 2014-09-11 DIAGNOSIS — K219 Gastro-esophageal reflux disease without esophagitis: Secondary | ICD-10-CM | POA: Insufficient documentation

## 2014-09-11 DIAGNOSIS — K2990 Gastroduodenitis, unspecified, without bleeding: Secondary | ICD-10-CM

## 2014-09-11 DIAGNOSIS — K2970 Gastritis, unspecified, without bleeding: Secondary | ICD-10-CM

## 2014-09-11 NOTE — Assessment & Plan Note (Signed)
CONTINUE OMEPRAZOLE.  TAKE 30 MINUTES PRIOR TO YOUR FIRST MEAL. LOW FAT DIET AVOID TRIGGERS

## 2014-09-11 NOTE — Assessment & Plan Note (Signed)
MOST LIKELY DUE TO IBS-D.  CONTINUE BENTYL & PROBIOTIC AVOID ITEMS THAT CAUSE  FOLLOW UP IN 4 MOS.

## 2014-09-11 NOTE — Progress Notes (Signed)
PATIENT NIC'D  °

## 2014-09-11 NOTE — Progress Notes (Signed)
Subjective:    Patient ID: [name], female    DOB: [birth year], 38 y.o.   MRN: [id number]  HAWKINS,EDWARD L, MD  HPI Having less diarrhea than before. Can have diarrhea a couple times a day. Bentyl: 2-3 x/day. In AM DRINKS A LOT OF SODA(PEPSI REGULAR). STARTS RUMBLING. OCCASIONAL NAUSEA: ZOFRAN HELPS. STRESS: HIGH. MAY HAVE HEARTBURN IN UPPER ABDOMEN AND CHEST: 2-3 TIMES A WEEK.   PT DENIES FEVER, CHILLS, HEMATOCHEZIA, HEMATEMESIS,  vomiting, melena, CHEST PAIN, SHORTNESS OF BREATH,  constipation, problems swallowing, OR problems with sedation.  Past Medical History  Diagnosis Date  . Depression with anxiety   . Acute ear infection   . GERD (gastroesophageal reflux disease)    Past Surgical History  Procedure Laterality Date  . Tubal ligation    . Endometrial ablation    . Colonoscopy N/A 07/30/2014    Procedure: COLONOSCOPY;  Surgeon: Danie Binder, MD;  Location: [facility];  Service: Endoscopy;  Laterality: N/A;  12:45  . Tooth extraction Bilateral 2015    molars  . Esophagogastroduodenoscopy (egd) with propofol N/A 08/27/2014    Procedure: ESOPHAGOGASTRODUODENOSCOPY (EGD) WITH PROPOFOL;  Surgeon: Danie Binder, MD;  Location: [facility];  Service: Endoscopy;  Laterality: N/A;  9:30  . Esophageal biopsy N/A 08/27/2014    Procedure: BIOPSY;  Surgeon: Danie Binder, MD;  Location: [facility];  Service: Endoscopy;  Laterality: N/A;  DUODENAL AND GASTRIC  No Known Allergies  Current Outpatient Prescriptions  Medication Sig Dispense Refill  . ALPRAZolam (XANAX) 1 MG tablet Take 1 mg by mouth 3 (three) times daily.    Marland Kitchen dicyclomine (BENTYL) 10 MG capsule 1-2 po 30 mins prior to meals and at bedtime.    Marland Kitchen HYDROcodone-acetaminophen (NORCO) 10-325 MG per tablet Take 1 tablet by mouth every 4 (four) hours as needed for moderate pain.     . methocarbamol (ROBAXIN) 500 MG tablet Take 500 mg by mouth 3 (three) times daily.    Marland Kitchen omeprazole (PRILOSEC) 20 MG capsule 1 po every morning 30  minutes prior to your first meal.    . ondansetron (ZOFRAN ODT) 4 MG disintegrating tablet Take 1 tablet (4 mg total) by mouth every 8 (eight) hours as needed for nausea or vomiting.    . Probiotic Product (ALIGN) 4 MG CAPS Take 1 capsule by mouth daily.      . sertraline (ZOLOFT) 50 MG tablet Take 50 mg by mouth daily.      Marland Kitchen zolpidem (AMBIEN) 10 MG tablet Take 10 mg by mouth at bedtime.          Review of Systems     Objective:   Physical Exam  Vitals reviewed. Constitutional: She is oriented to person, place, and time. She appears well-developed and well-nourished. No distress.  HENT:  Head: Normocephalic and atraumatic.  Mouth/Throat: Oropharynx is clear and moist. No oropharyngeal exudate.  Eyes: Pupils are equal, round, and reactive to light. No scleral icterus.  Neck: Normal range of motion. Neck supple.  Cardiovascular: Normal rate, regular rhythm and normal heart sounds.   Pulmonary/Chest: Effort normal and breath sounds normal. No respiratory distress.  Abdominal: Soft. Bowel sounds are normal. She exhibits no distension. There is no tenderness.  Musculoskeletal: She exhibits no edema.  Lymphadenopathy:    She has no cervical adenopathy.  Neurological: She is alert and oriented to person, place, and time.  Psychiatric: She has a normal mood and affect.  Assessment & Plan:

## 2014-09-11 NOTE — Patient Instructions (Signed)
MANAGE YOUR STRESS.  CUT DOWN ON THE PEPSI.  CONTINUE BENTYL AND THE PROBIOTIC.  CONTINUE OMEPRAZOLE.  TAKE 30 MINUTES PRIOR TO YOUR FIRST MEAL.  FOLLOW A HIGH FIBER/LOW FAT DIET.   AVOID TRIGGERS FOR REFLUX. SEE INFO BELOW.   Lifestyle and home remedies TO HELP CONTROL HEARTBURN.  You may eliminate or reduce the frequency of heartburn by making the following lifestyle changes:    Control your weight. Being overweight is a major risk factor for heartburn and GERD. Excess pounds put pressure on your abdomen, pushing up your stomach and causing acid to back up into your esophagus.     Eat smaller meals. 4 TO 6 MEALS A DAY. This reduces pressure on the lower esophageal sphincter, helping to prevent the valve from opening and acid from washing back into your esophagus.     Loosen your belt. Clothes that fit tightly around your waist put pressure on your abdomen and the lower esophageal sphincter.       Eliminate heartburn triggers. Everyone has specific triggers.      Common triggers such as fatty or fried foods, spicy food, tomato sauce, carbonated beverages, alcohol, chocolate, mint, garlic, onion, caffeine and nicotine may make heartburn worse.     Avoid stooping or bending. Tying your shoes is OK. Bending over for longer periods to weed your garden isn't, especially soon after eating.     Don't lie down after a meal. Wait at least three to four hours after eating before going to bed, and don't lie down right after eating.     PLACE THE HEAD OF YOUR BED ON 6 INCH BLOCKS.  Alternative medicine   Several home remedies exist for treating GERD, but they provide only temporary relief. They include drinking baking soda (sodium bicarbonate) added to water or drinking other fluids such as baking soda mixed with cream of tartar and water.   Although these liquids create temporary relief by neutralizing, washing away or buffering acids, eventually they aggravate the situation by adding gas  and fluid to your stomach, increasing pressure and causing more acid reflux. Further, adding more sodium to your diet may increase your blood pressure and add stress to your heart, and excessive bicarbonate ingestion can alter the acid-base balance in your body.

## 2014-09-11 NOTE — Assessment & Plan Note (Addendum)
SX NOT IDEALLY CONTROLLED.  CONTINUE OMEPRAZOLE.  TAKE 30 MINUTES PRIOR TO YOUR FIRST MEAL. LOW FAT DIET AVOID TRIGGERS FOLLOW UP IN 4 MOS.

## 2014-09-17 NOTE — Progress Notes (Signed)
cc'ed to pcp °

## 2014-10-08 ENCOUNTER — Encounter: Payer: Self-pay | Admitting: Gastroenterology

## 2014-11-06 ENCOUNTER — Other Ambulatory Visit (HOSPITAL_COMMUNITY)
Admission: RE | Admit: 2014-11-06 | Discharge: 2014-11-06 | Disposition: A | Discharge status: Home / Self Care | Payer: Medicaid Other | Source: Ambulatory Visit | Attending: Obstetrics and Gynecology | Admitting: Obstetrics and Gynecology

## 2014-11-06 ENCOUNTER — Encounter: Payer: Self-pay | Admitting: Obstetrics and Gynecology

## 2014-11-06 ENCOUNTER — Ambulatory Visit (INDEPENDENT_AMBULATORY_CARE_PROVIDER_SITE_OTHER): Payer: Medicaid Other | Admitting: Obstetrics and Gynecology

## 2014-11-06 ENCOUNTER — Other Ambulatory Visit: Payer: Self-pay | Admitting: Gastroenterology

## 2014-11-06 VITALS — BP 146/80 | Ht 63.0 in | Wt 134.0 lb

## 2014-11-06 DIAGNOSIS — Z01419 Encounter for gynecological examination (general) (routine) without abnormal findings: Secondary | ICD-10-CM

## 2014-11-06 DIAGNOSIS — Z113 Encounter for screening for infections with a predominantly sexual mode of transmission: Secondary | ICD-10-CM | POA: Diagnosis present

## 2014-11-06 DIAGNOSIS — Z1151 Encounter for screening for human papillomavirus (HPV): Secondary | ICD-10-CM | POA: Diagnosis present

## 2014-11-06 DIAGNOSIS — Z0000 Encounter for general adult medical examination without abnormal findings: Secondary | ICD-10-CM

## 2014-11-06 NOTE — Progress Notes (Signed)
Patient ID: [name], female   DOB: 12-Nov-1976, 38 y.o.   MRN: [id number] Pt here today for annual exam. Pt denies any problems or concerns at this time.

## 2014-11-06 NOTE — Progress Notes (Signed)
Assessment:  Annual Gyn Exam   Plan:  1. Pap smear done, next pap due 3 years 2. return annually or prn 3    Annual mammogram advised Subjective:  EFFA YARROW is a 38 y.o. female No obstetric history on file. who presents for annual exam. No LMP recorded. Patient has had an ablation. The patient has complaints today of annual exam. She states that she occassionally spots. She states that she has had an ablation. She denies any complaints. She states that she had blood work done in August.   The following portions of the patient's history were reviewed and updated as appropriate: allergies, current medications, past family history, past medical history, past social history, past surgical history and problem list. Past Medical History  Diagnosis Date  . Depression with anxiety   . Acute ear infection   . GERD (gastroesophageal reflux disease)     Past Surgical History  Procedure Laterality Date  . Tubal ligation    . Endometrial ablation    . Colonoscopy N/A 07/30/2014    Procedure: COLONOSCOPY;  Surgeon: Danie Binder, MD;  [facility]: [facility];  Service: Endoscopy;  Laterality: N/A;  12:45  . Tooth extraction Bilateral 2015    molars  . Esophagogastroduodenoscopy (egd) with propofol N/A 08/27/2014    Procedure: ESOPHAGOGASTRODUODENOSCOPY (EGD) WITH PROPOFOL;  Surgeon: Danie Binder, MD;  Location: [facility];  Service: Endoscopy;  Laterality: N/A;  9:30  . Esophageal biopsy N/A 08/27/2014    Procedure: BIOPSY;  Surgeon: Danie Binder, MD;  Location: [facility];  Service: Endoscopy;  Laterality: N/A;  DUODENAL AND GASTRIC    Current outpatient prescriptions: ALPRAZolam (XANAX) 1 MG tablet, Take 1 mg by mouth 3 (three) times daily., Disp: , Rfl: ;  dicyclomine (BENTYL) 10 MG capsule, 1-2 po 30 mins prior to meals and at bedtime., Disp: 120 capsule, Rfl: 11;  HYDROcodone-acetaminophen (NORCO) 10-325 MG per tablet, Take 1 tablet by mouth every 4 (four) hours as needed for moderate pain. ,  Disp: , Rfl:  methocarbamol (ROBAXIN) 500 MG tablet, Take 500 mg by mouth 3 (three) times daily., Disp: , Rfl: ;  omeprazole (PRILOSEC) 20 MG capsule, 1 po every morning 30 minutes prior to your first meal., Disp: 30 capsule, Rfl: 11;  ondansetron (ZOFRAN ODT) 4 MG disintegrating tablet, Take 1 tablet (4 mg total) by mouth every 8 (eight) hours as needed for nausea or vomiting., Disp: 60 tablet, Rfl: 0 Probiotic Product (ALIGN) 4 MG CAPS, Take 1 capsule by mouth daily., Disp: , Rfl: ;  sertraline (ZOLOFT) 50 MG tablet, Take 50 mg by mouth daily., Disp: , Rfl: ;  zolpidem (AMBIEN) 10 MG tablet, Take 10 mg by mouth at bedtime., Disp: , Rfl:   Review of Systems Constitutional: negative Gastrointestinal: negative Genitourinary: negative  Objective:  BP 146/80 mmHg  Ht 5\' 3"  (1.6 m)  Wt 134 lb (60.782 kg)  BMI 23.74 kg/m2   BMI: Body mass index is 23.74 kg/(m^2).  General Appearance: Alert, appropriate appearance for age. No acute distress HEENT: Grossly normal Neck / Thyroid:  Cardiovascular: RRR; normal S1, S2, no murmur Lungs: CTA bilaterally Back: No CVAT Breast Exam: Normal to inspection, Normal breast tissue bilaterally and No masses or nodes.No dimpling, nipple retraction or discharge. Gastrointestinal: Soft, non-tender, no masses or organomegaly Pelvic Exam: Vulva and vagina appear normal. Bimanual exam reveals normal uterus and adnexa. Vaginal: normal mucosa without prolapse or lesions, normal without tenderness, induration or masses and normal rugae Cervix: normal  appearance and normal secretions and normal support Adnexa: normal bimanual exam Uterus: normal single, nontender Rectovaginal: not indicated Lymphatic Exam: Non-palpable nodes in neck, clavicular, axillary, or inguinal regions  Skin: no rash or abnormalities Neurologic: Normal gait and speech, no tremor  Psychiatric: Alert and oriented, appropriate affect.  Urinalysis:Not done  A: 1. Pap completed in office 2.  Visit in 3 yr  P: 1. F/U for   This chart was scribed for Jonnie Kind, MD by Steva Colder, ED Scribe. The patient was seen in room 3 at 4:38 PM.

## 2014-11-08 LAB — CYTOLOGY - PAP

## 2014-11-15 ENCOUNTER — Telehealth: Payer: Self-pay | Admitting: *Deleted

## 2014-11-15 NOTE — Telephone Encounter (Signed)
-----   Message from Jonnie Kind, MD sent at 11/11/2014  9:32 AM EST ----- Pap normal but + HPV virus. Will repeat pap 1 yr

## 2014-11-15 NOTE — Telephone Encounter (Signed)
Pt informed of +HPV, negative GC/CHL results from pap 11/06/2014 repeat in 1 year. Pt verbalized understanding.

## 2014-12-23 ENCOUNTER — Encounter: Payer: Self-pay | Admitting: Gastroenterology

## 2015-05-08 ENCOUNTER — Other Ambulatory Visit (HOSPITAL_COMMUNITY): Payer: Self-pay | Admitting: Pulmonary Disease

## 2015-05-08 DIAGNOSIS — E041 Nontoxic single thyroid nodule: Secondary | ICD-10-CM

## 2015-05-12 ENCOUNTER — Other Ambulatory Visit (HOSPITAL_COMMUNITY): Payer: Medicaid Other

## 2015-05-14 ENCOUNTER — Ambulatory Visit (HOSPITAL_COMMUNITY)
Admission: RE | Admit: 2015-05-14 | Discharge: 2015-05-14 | Disposition: A | Discharge status: Home / Self Care | Payer: Medicaid Other | Source: Ambulatory Visit | Attending: Pulmonary Disease | Admitting: Pulmonary Disease

## 2015-05-14 DIAGNOSIS — E041 Nontoxic single thyroid nodule: Secondary | ICD-10-CM | POA: Insufficient documentation

## 2015-08-13 ENCOUNTER — Other Ambulatory Visit: Payer: Self-pay | Admitting: Gastroenterology

## 2015-10-30 ENCOUNTER — Ambulatory Visit (HOSPITAL_COMMUNITY)
Admission: RE | Admit: 2015-10-30 | Discharge: 2015-10-30 | Disposition: A | Discharge status: Home / Self Care | Payer: Medicaid Other | Source: Ambulatory Visit | Attending: Pulmonary Disease | Admitting: Pulmonary Disease

## 2015-10-30 ENCOUNTER — Other Ambulatory Visit (HOSPITAL_COMMUNITY): Payer: Self-pay | Admitting: Pulmonary Disease

## 2015-10-30 DIAGNOSIS — G8929 Other chronic pain: Secondary | ICD-10-CM | POA: Insufficient documentation

## 2015-10-30 DIAGNOSIS — M542 Cervicalgia: Secondary | ICD-10-CM

## 2016-07-27 ENCOUNTER — Other Ambulatory Visit: Payer: Self-pay | Admitting: Gastroenterology

## 2016-09-03 ENCOUNTER — Other Ambulatory Visit (HOSPITAL_COMMUNITY): Payer: Self-pay | Admitting: Pulmonary Disease

## 2016-09-03 DIAGNOSIS — M545 Low back pain: Secondary | ICD-10-CM

## 2016-09-13 ENCOUNTER — Other Ambulatory Visit (HOSPITAL_COMMUNITY): Payer: Self-pay | Admitting: Pulmonary Disease

## 2016-09-13 DIAGNOSIS — M545 Low back pain: Secondary | ICD-10-CM

## 2016-09-16 ENCOUNTER — Ambulatory Visit (HOSPITAL_COMMUNITY)
Admission: RE | Admit: 2016-09-16 | Discharge: 2016-09-16 | Disposition: A | Discharge status: Home / Self Care | Payer: Medicaid Other | Source: Ambulatory Visit | Attending: Pulmonary Disease | Admitting: Pulmonary Disease

## 2016-09-16 ENCOUNTER — Ambulatory Visit (HOSPITAL_COMMUNITY): Payer: Medicaid Other

## 2016-09-16 DIAGNOSIS — M542 Cervicalgia: Secondary | ICD-10-CM | POA: Diagnosis present

## 2016-09-16 DIAGNOSIS — M545 Low back pain: Secondary | ICD-10-CM

## 2016-12-11 IMAGING — MR MR CERVICAL SPINE W/O CM
4 of 5 series · 14 of 48 positions shown · non-contrast
Comparison: Cervical spine radiograph 10/30/2015

CLINICAL DATA: Neck pain and stiffness radiating into both arms.

EXAM:
MRI CERVICAL SPINE WITHOUT CONTRAST
TECHNIQUE: Multiplanar, multisequence MR imaging of the cervical spine was
performed. No intravenous contrast was administered.

[Series 3: T2 · sagittal · 3.0mm · 0.44mm/px · 5 of 13 slices shown (1 of 2)]
[im 1/13]
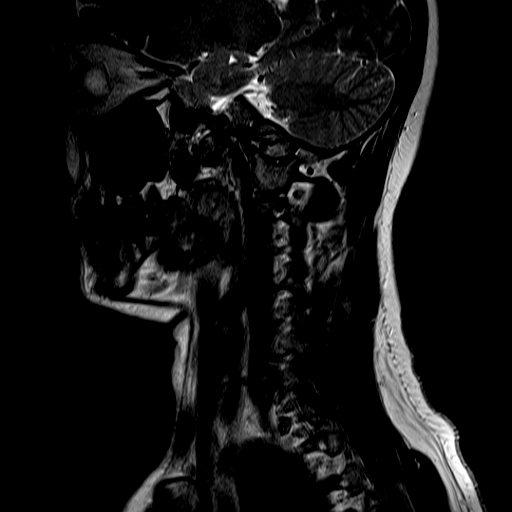
[im 3/13]
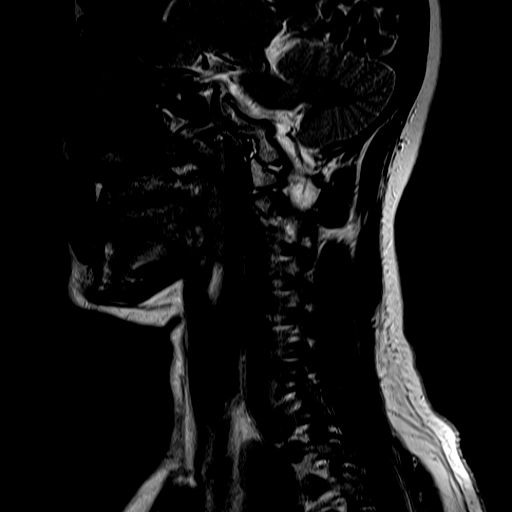
[im 5/13]
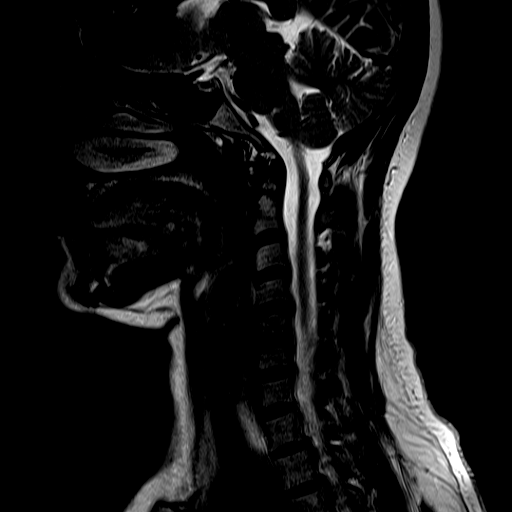
[im 8/13]
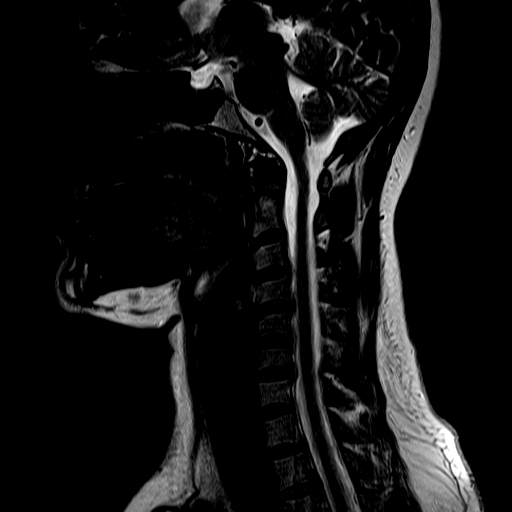
[im 13/13]
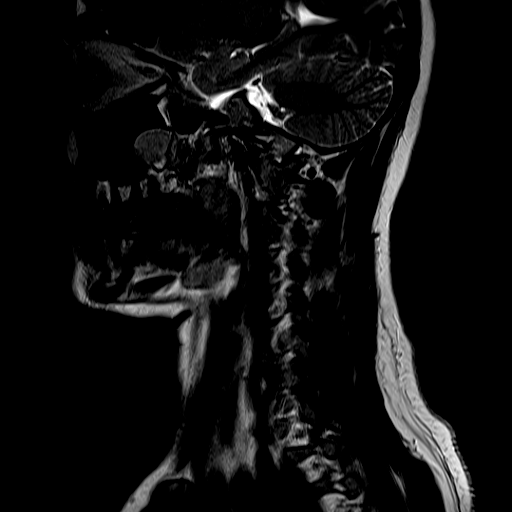

[Series 4: FLAIR · sagittal · 3.0mm · 0.47mm/px · 3 of 13 slices shown]
[im 3/13]
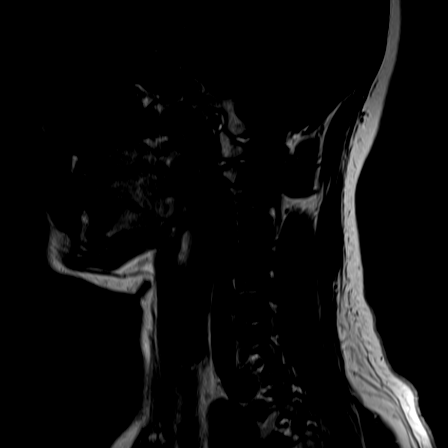
[im 8/13]
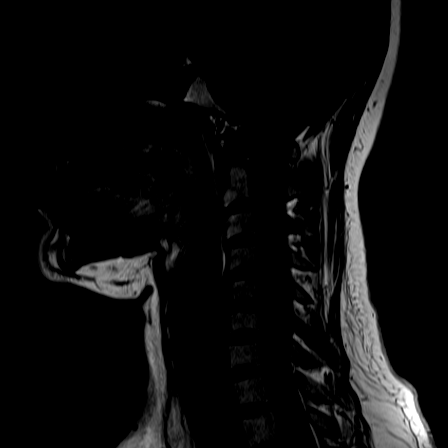
[im 13/13]
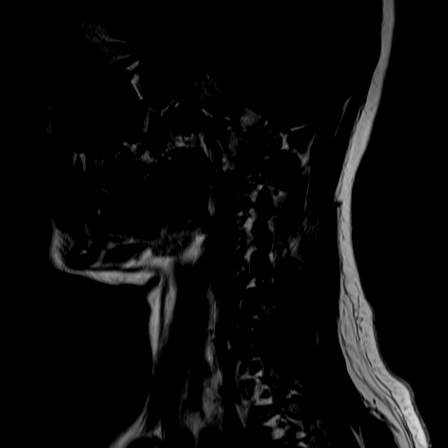

[Series 5: ir sagital · sagittal · 3.0mm · 0.25mm/px · 3 of 13 slices shown]
[im 3/13]
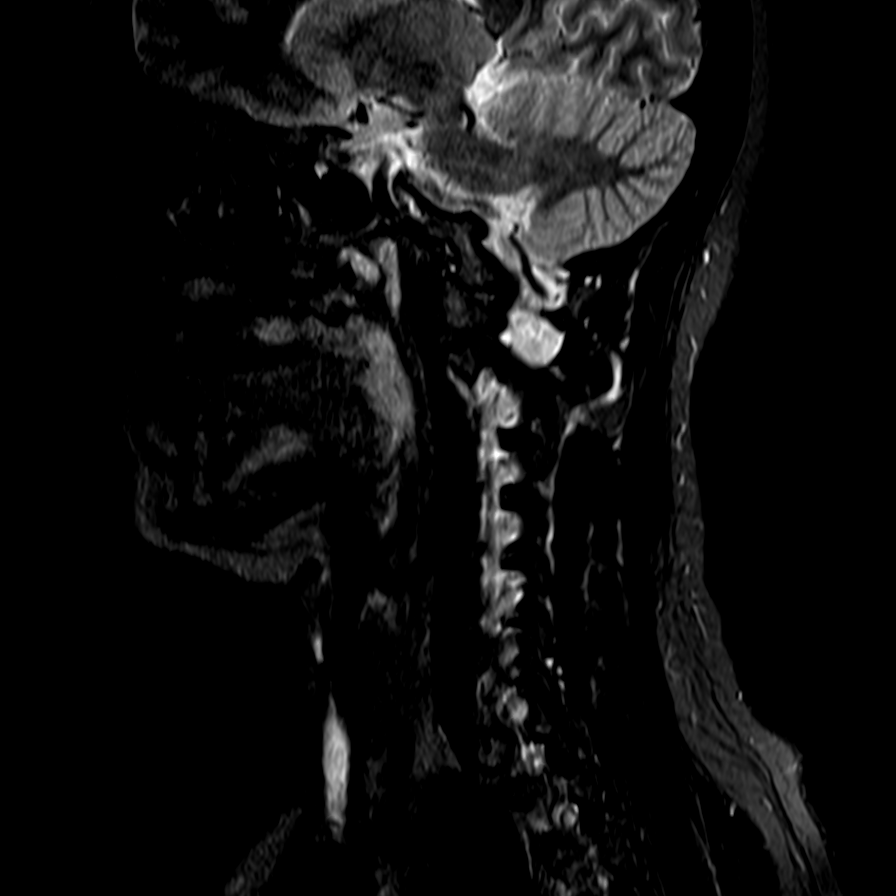
[im 8/13]
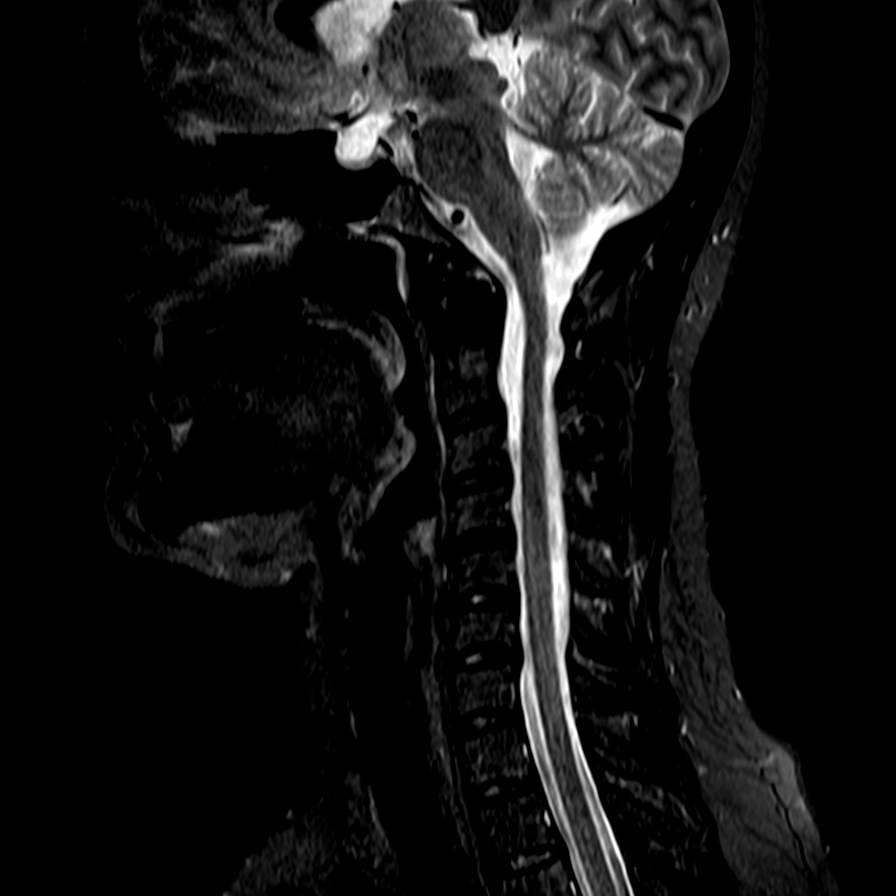
[im 13/13]
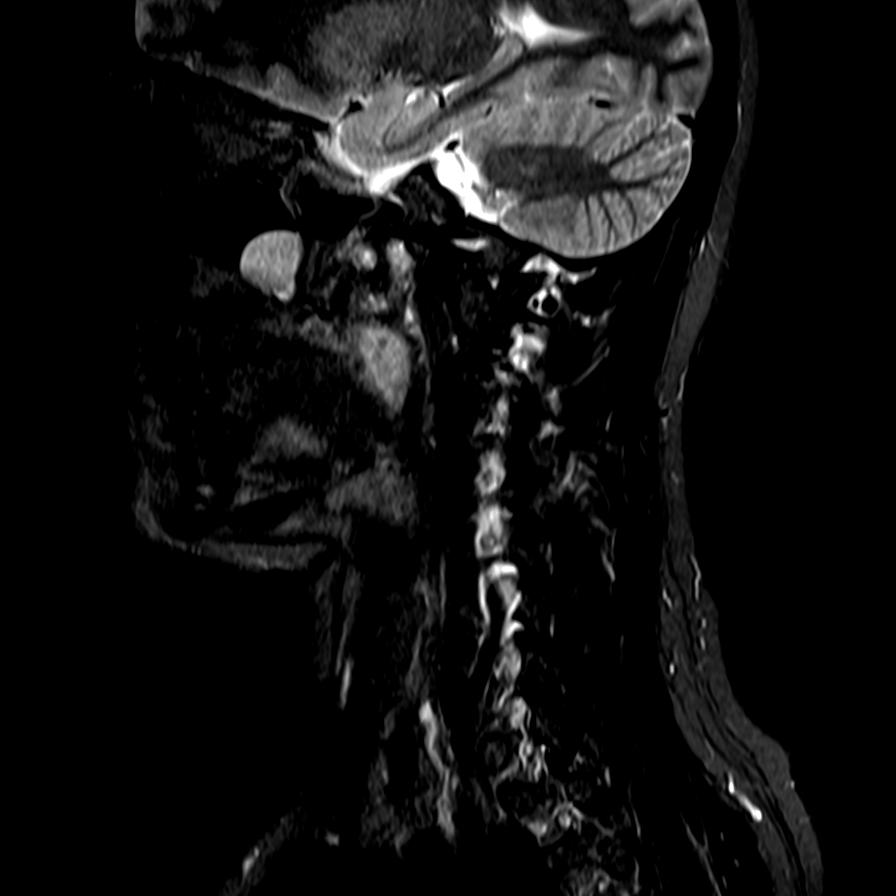

[Series 7: T2 · axial · 3.0mm · 0.16mm/px · z∈[-55,+16]mm · 3 of 32 slices shown (2 of 2)]
[im 5/32]
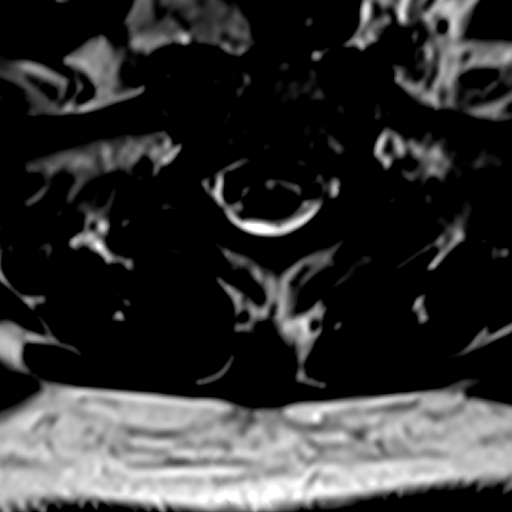
[im 16/32]
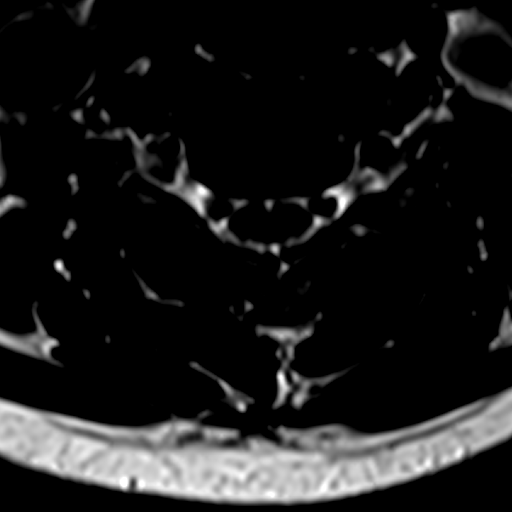
[im 27/32]
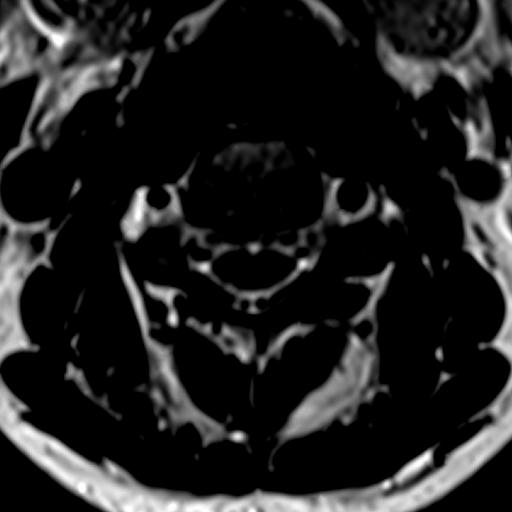

[14 of 48 positions shown; findings below may reference images not displayed]

FINDINGS: Alignment: Mild straightening of the normal cervical lordosis. No
listhesis.

Vertebrae: No acute compression fracture, facet edema or focal
marrow lesion.

Cord: Normal caliber and signal

Posterior Fossa, vertebral arteries, paraspinal tissues: Visualized
posterior fossa is normal. Vertebral artery flow voids are
preserved. Normal visualized paraspinal soft tissues.

Disc levels:

C1-C2: Normal.

C2-C3: Normal disc space and facet joints. No spinal canal stenosis.
No neuroforaminal stenosis.

C3-C4: Normal disc space and facet joints. No spinal canal stenosis.
No neuroforaminal stenosis.

C4-C5: Mild right uncovertebral spurring. No spinal canal stenosis.
No neuroforaminal stenosis.

C5-C6: Normal disc space and facet joints. No spinal canal stenosis.
No neuroforaminal stenosis.

C6-C7: Mild bilateral uncovertebral hypertrophy. No spinal canal
stenosis. No neuroforaminal stenosis.

C7-T1: Normal disc space and facet joints. No spinal canal stenosis.
No neuroforaminal stenosis.
IMPRESSION: Minimal uncovertebral degenerative disease. No spinal canal or
neural foraminal stenosis.

## 2017-03-09 ENCOUNTER — Other Ambulatory Visit: Payer: Self-pay

## 2017-03-09 MED ORDER — OMEPRAZOLE 20 MG PO CPDR: DELAYED_RELEASE_CAPSULE | ORAL | 3 refills | Status: DC

## 2018-03-17 DIAGNOSIS — Z79891 Long term (current) use of opiate analgesic: Secondary | ICD-10-CM | POA: Diagnosis not present

## 2018-04-06 DIAGNOSIS — J449 Chronic obstructive pulmonary disease, unspecified: Secondary | ICD-10-CM | POA: Diagnosis not present

## 2018-04-06 DIAGNOSIS — E785 Hyperlipidemia, unspecified: Secondary | ICD-10-CM | POA: Diagnosis not present

## 2018-04-06 DIAGNOSIS — F17200 Nicotine dependence, unspecified, uncomplicated: Secondary | ICD-10-CM | POA: Diagnosis not present

## 2018-04-06 DIAGNOSIS — I10 Essential (primary) hypertension: Secondary | ICD-10-CM | POA: Diagnosis not present

## 2018-04-14 DIAGNOSIS — F17200 Nicotine dependence, unspecified, uncomplicated: Secondary | ICD-10-CM | POA: Diagnosis not present

## 2018-04-14 DIAGNOSIS — E785 Hyperlipidemia, unspecified: Secondary | ICD-10-CM | POA: Diagnosis not present

## 2018-04-14 DIAGNOSIS — J449 Chronic obstructive pulmonary disease, unspecified: Secondary | ICD-10-CM | POA: Diagnosis not present

## 2018-04-14 DIAGNOSIS — I10 Essential (primary) hypertension: Secondary | ICD-10-CM | POA: Diagnosis not present

## 2018-07-06 DIAGNOSIS — E785 Hyperlipidemia, unspecified: Secondary | ICD-10-CM | POA: Diagnosis not present

## 2018-07-06 DIAGNOSIS — J449 Chronic obstructive pulmonary disease, unspecified: Secondary | ICD-10-CM | POA: Diagnosis not present

## 2018-07-06 DIAGNOSIS — I10 Essential (primary) hypertension: Secondary | ICD-10-CM | POA: Diagnosis not present

## 2018-07-06 DIAGNOSIS — F419 Anxiety disorder, unspecified: Secondary | ICD-10-CM | POA: Diagnosis not present

## 2019-01-25 DIAGNOSIS — J449 Chronic obstructive pulmonary disease, unspecified: Secondary | ICD-10-CM | POA: Diagnosis not present

## 2019-01-25 DIAGNOSIS — I10 Essential (primary) hypertension: Secondary | ICD-10-CM | POA: Diagnosis not present

## 2019-01-25 DIAGNOSIS — R079 Chest pain, unspecified: Secondary | ICD-10-CM | POA: Diagnosis not present

## 2019-01-25 DIAGNOSIS — G8929 Other chronic pain: Secondary | ICD-10-CM | POA: Diagnosis not present

## 2019-01-25 DIAGNOSIS — Z79891 Long term (current) use of opiate analgesic: Secondary | ICD-10-CM | POA: Diagnosis not present

## 2019-01-25 DIAGNOSIS — K210 Gastro-esophageal reflux disease with esophagitis: Secondary | ICD-10-CM | POA: Diagnosis not present

## 2019-04-19 DIAGNOSIS — Z6825 Body mass index (BMI) 25.0-25.9, adult: Secondary | ICD-10-CM | POA: Diagnosis not present

## 2019-04-19 DIAGNOSIS — R112 Nausea with vomiting, unspecified: Secondary | ICD-10-CM | POA: Diagnosis not present

## 2019-04-19 DIAGNOSIS — K529 Noninfective gastroenteritis and colitis, unspecified: Secondary | ICD-10-CM | POA: Diagnosis not present

## 2019-04-26 DIAGNOSIS — I10 Essential (primary) hypertension: Secondary | ICD-10-CM | POA: Diagnosis not present

## 2019-04-26 DIAGNOSIS — F419 Anxiety disorder, unspecified: Secondary | ICD-10-CM | POA: Diagnosis not present

## 2019-04-26 DIAGNOSIS — F17200 Nicotine dependence, unspecified, uncomplicated: Secondary | ICD-10-CM | POA: Diagnosis not present

## 2019-04-26 DIAGNOSIS — J449 Chronic obstructive pulmonary disease, unspecified: Secondary | ICD-10-CM | POA: Diagnosis not present

## 2019-06-20 ENCOUNTER — Other Ambulatory Visit: Payer: Self-pay | Admitting: Internal Medicine

## 2019-06-20 ENCOUNTER — Other Ambulatory Visit: Payer: Self-pay

## 2019-06-20 DIAGNOSIS — Z20822 Contact with and (suspected) exposure to covid-19: Secondary | ICD-10-CM

## 2019-06-24 LAB — NOVEL CORONAVIRUS, NAA: SARS-CoV-2, NAA: NOT DETECTED

## 2019-07-24 ENCOUNTER — Encounter: Payer: Self-pay | Admitting: Gastroenterology

## 2019-09-21 ENCOUNTER — Encounter: Payer: Self-pay | Admitting: Gastroenterology

## 2019-09-21 ENCOUNTER — Other Ambulatory Visit: Payer: Self-pay

## 2019-09-21 ENCOUNTER — Ambulatory Visit: Payer: BC Managed Care – PPO | Admitting: Gastroenterology

## 2019-09-21 ENCOUNTER — Ambulatory Visit: Payer: Self-pay | Admitting: Gastroenterology

## 2019-09-21 VITALS — BP 151/87 | HR 91 | Temp 97.1°F | Ht 63.0 in | Wt 144.6 lb

## 2019-09-21 DIAGNOSIS — R1013 Epigastric pain: Secondary | ICD-10-CM | POA: Insufficient documentation

## 2019-09-21 DIAGNOSIS — R195 Other fecal abnormalities: Secondary | ICD-10-CM | POA: Diagnosis not present

## 2019-09-21 DIAGNOSIS — Z800 Family history of malignant neoplasm of digestive organs: Secondary | ICD-10-CM | POA: Insufficient documentation

## 2019-09-21 DIAGNOSIS — R112 Nausea with vomiting, unspecified: Secondary | ICD-10-CM | POA: Insufficient documentation

## 2019-09-21 DIAGNOSIS — K219 Gastro-esophageal reflux disease without esophagitis: Secondary | ICD-10-CM

## 2019-09-21 MED ORDER — PANTOPRAZOLE SODIUM 40 MG PO TBEC: 40.0000 mg | DELAYED_RELEASE_TABLET | Freq: Every day | ORAL | 5 refills | Status: DC

## 2019-09-21 MED ORDER — DICYCLOMINE HCL 10 MG PO CAPS: 10.0000 mg | ORAL_CAPSULE | Freq: Three times a day (TID) | ORAL | 5 refills | Status: AC

## 2019-09-21 NOTE — Progress Notes (Addendum)
Primary Care Physician:  Sinda Du, MD  Primary Gastroenterologist:  Barney Drain, MD  REVIEWED-NO ADDITIONAL RECOMMENDATIONS.  Chief Complaint  Patient presents with  . Diarrhea    HPI:  [name] is a 43 y.o. female here for follow-up of diarrhea.  She was last seen in 2015 with similar symptoms.  In 2015 she had had chronic diarrhea for 9 months, associated with 20 pound weight loss.  She had a colonoscopy in August 2015, 2 colon polyps removed which were benign.  Random colon biopsies negative.  Recommended 5-year follow-up colonoscopy for family history of colon cancer.  She underwent an EGD September 2015 and it showed nonerosive gastritis, no H. pylori, small bowel biopsies negative for celiac.  Celiac serologies also negative.  Stool negative for Giardia.  She never completed 24 hours stool studies for osmolality and potassium.  Every morning vomits undigested food from the night before.  She works second shift, typically eats around 1 AM but will wake up around 11 AM with vomiting.  Going on for a couple months.  During daytime hours while she is awake, will start gagging and sometimes spit up a little clear liquid.  Denies typical heartburn.  Does have anywhere from 5-10 loose to watery stools daily.  She cannot remember the last time she had a solid stool. No melena, brbpr. Gained 15 pounds.   Chronically on hydrocodone for back and neck pain.  Denies any NSAID or aspirin use.   Current Outpatient Medications  Medication Sig Dispense Refill  . ALPRAZolam (XANAX) 1 MG tablet Take 1 mg by mouth 3 (three) times daily.    . hydrochlorothiazide (HYDRODIURIL) 12.5 MG tablet Take 1 tablet by mouth 2 (two) times daily.    Marland Kitchen HYDROcodone-acetaminophen (NORCO) 10-325 MG per tablet Take 1 tablet by mouth every 4 (four) hours as needed for moderate pain.     Marland Kitchen lisinopril (ZESTRIL) 20 MG tablet Take 1 tablet by mouth 2 (two) times daily.    . methocarbamol (ROBAXIN) 500 MG tablet  Take 500 mg by mouth 3 (three) times daily.    . ondansetron (ZOFRAN-ODT) 4 MG disintegrating tablet DISSOLVE ONE TABLET BY MOUTH EVERY 8 HOURS AS NEEDED FOR NAUSEA OR VOMITING. 60 tablet 3  . rosuvastatin (CRESTOR) 20 MG tablet Take 1 tablet by mouth daily.    . sertraline (ZOLOFT) 50 MG tablet Take 50 mg by mouth daily.     No current facility-administered medications for this visit.     Allergies as of 09/21/2019  . (No Known Allergies)    Past Medical History:  Diagnosis Date  . Acute ear infection   . Depression with anxiety   . GERD (gastroesophageal reflux disease)     Past Surgical History:  Procedure Laterality Date  . BIOPSY N/A 08/27/2014   Procedure: BIOPSY;  Surgeon: Danie Binder, MD;  Location: [facility];  Service: Endoscopy;  Laterality: N/A;  DUODENAL AND GASTRIC  . COLONOSCOPY N/A 07/30/2014   Dr. Oneida Alar: 2 colon polyps removed, small internal hemorrhoids.  Random colon biopsies negative.  Colon polyps benign.  Next colonoscopy in 5 years due to family history  . endometrial ablation    . ESOPHAGOGASTRODUODENOSCOPY (EGD) WITH PROPOFOL N/A 08/27/2014   Dr. Oneida Alar: Small hiatal hernia, mild nonerosive gastritis.  Small bowel biopsy negative for celiac.  Gastric biopsy benign with no evidence of H. pylori.  . TOOTH EXTRACTION Bilateral 2015   molars  . TUBAL LIGATION      Family History  Problem Relation Age of Onset  . Colon cancer Mother   . Cancer Father        lung  . Celiac disease Neg Hx   . Crohn's disease Neg Hx   . Ulcerative colitis Neg Hx     Social History   Socioeconomic History  . Marital status: Divorced    Spouse name: Not on file  . Number of children: Not on file  . Years of education: Not on file  . Highest education level: Not on file  Occupational History  . Occupation: Scientist, forensic    Comment: [employer]  . Financial resource strain: Not on file  . Food insecurity    Worry: Not on file    Inability: Not on file   . Transportation needs    Medical: Not on file    Non-medical: Not on file  Tobacco Use  . Smoking status: Current Every Day Smoker    Packs/day: 1.00    Years: 22.00    Pack years: 22.00    Types: Cigarettes  . Smokeless tobacco: Never Used  . Tobacco comment: less than a pack  Substance and Sexual Activity  . Alcohol use: Yes    Comment: occ  . Drug use: No  . Sexual activity: Yes    Birth control/protection: Surgical  Lifestyle  . Physical activity    Days per week: Not on file    Minutes per session: Not on file  . Stress: Not on file  Relationships  . Social Herbalist on phone: Not on file    Gets together: Not on file    Attends religious service: Not on file    Active member of club or organization: Not on file    Attends meetings of clubs or organizations: Not on file    Relationship status: Not on file  . Intimate partner violence    Fear of current or ex partner: Not on file    Emotionally abused: Not on file    Physically abused: Not on file    Forced sexual activity: Not on file  Other Topics Concern  . Not on file  Social History Narrative  . Not on file      ROS:  General: Negative for anorexia, weight loss, fever, chills, fatigue, weakness. Eyes: Negative for vision changes.  ENT: Negative for hoarseness, difficulty swallowing , nasal congestion. CV: Negative for chest pain, angina, palpitations, dyspnea on exertion, peripheral edema.  Respiratory: Negative for dyspnea at rest, dyspnea on exertion, cough, sputum, wheezing.  GI: See history of present illness. GU:  Negative for dysuria, hematuria, urinary incontinence, urinary frequency, nocturnal urination.  MS: Negative for joint pain, positive low back pain.  Derm: Negative for rash or itching.  Neuro: Negative for weakness, abnormal sensation, seizure, frequent headaches, memory loss, confusion.  Psych: Negative for anxiety, depression, suicidal ideation, hallucinations.  Endo:  Negative for unusual weight change.  Heme: Negative for bruising or bleeding. Allergy: Negative for rash or hives.    Physical Examination:  BP (!) 151/87   Pulse 91   Temp (!) 97.1 F (36.2 C) (Temporal)   Ht 5\' 3"  (1.6 m)   Wt 144 lb 9.6 oz (65.6 kg)   BMI 25.61 kg/m    General: Well-nourished, well-developed in no acute distress.  Head: Normocephalic, atraumatic.   Eyes: Conjunctiva pink, no icterus. Mouth: Oropharyngeal mucosa moist and pink , no lesions erythema or exudate. Neck: Supple without thyromegaly, masses, or  lymphadenopathy.  Lungs: Clear to auscultation bilaterally.  Heart: Regular rate and rhythm, no murmurs rubs or gallops.  Abdomen: Bowel sounds are normal, no hepatosplenomegaly or masses, no abdominal bruits or    hernia , no rebound or guarding.  Distended, tender upper abdomen Rectal: Not performed Extremities: No lower extremity edema. No clubbing or deformities.  Neuro: Alert and oriented x 4 , grossly normal neurologically.  Skin: Warm and dry, no rash or jaundice.   Psych: Alert and cooperative, normal mood and affect.    Imaging Studies: No results found.

## 2019-09-21 NOTE — Assessment & Plan Note (Signed)
Most likely IBS-D.  Resume Bentyl 10 mg before meals and nightly as needed.

## 2019-09-21 NOTE — Patient Instructions (Signed)
1. Please have labs and ultrasound done.  2. Start pantoprazole once daily before first meal of day for gastritis, relux. 3. Start dicyclomine one capsule up to four times daily to control abdominal cramps and diarrhea. Hold for constipation.  4. Colonoscopy as scheduled.

## 2019-09-21 NOTE — Assessment & Plan Note (Signed)
Due for high risk reading colonoscopy due to family history of colon cancer, mother less than age 42.  I have discussed the risks, alternatives, benefits with regards to but not limited to the risk of reaction to medication, bleeding, infection, perforation and the patient is agreeable to proceed. Written consent to be obtained.

## 2019-09-21 NOTE — Assessment & Plan Note (Signed)
Daily nausea vomiting associated with upper abdominal discomfort.  Differential diagnosis includes reflux, gastritis, biliary disease.  She denies typical heartburn.  Will resume PPI therapy.  Recommend abdominal ultrasound to rule out biliary etiology however.  Obtain labs.

## 2019-09-24 ENCOUNTER — Telehealth: Payer: Self-pay | Admitting: *Deleted

## 2019-09-24 NOTE — Telephone Encounter (Signed)
Pt returned call. Please call pt after 11:00 AM due to her job. Call back number [phone]

## 2019-09-24 NOTE — Telephone Encounter (Signed)
abd u/s scheduled for 10/2 at 8:30am, arrival 8:15am, npo midnight Patient also needs to be scheduled for TCS with [facility]-SLF  Called pt, VM full unable to leave message

## 2019-09-25 ENCOUNTER — Encounter: Payer: Self-pay | Admitting: *Deleted

## 2019-09-25 ENCOUNTER — Other Ambulatory Visit: Payer: Self-pay | Admitting: *Deleted

## 2019-09-25 DIAGNOSIS — R197 Diarrhea, unspecified: Secondary | ICD-10-CM

## 2019-09-25 DIAGNOSIS — Z800 Family history of malignant neoplasm of digestive organs: Secondary | ICD-10-CM

## 2019-09-25 DIAGNOSIS — R1013 Epigastric pain: Secondary | ICD-10-CM | POA: Diagnosis not present

## 2019-09-25 DIAGNOSIS — K219 Gastro-esophageal reflux disease without esophagitis: Secondary | ICD-10-CM | POA: Diagnosis not present

## 2019-09-25 DIAGNOSIS — R112 Nausea with vomiting, unspecified: Secondary | ICD-10-CM | POA: Diagnosis not present

## 2019-09-25 DIAGNOSIS — R195 Other fecal abnormalities: Secondary | ICD-10-CM | POA: Diagnosis not present

## 2019-09-25 MED ORDER — PEG 3350-KCL-NA BICARB-NACL 420 G PO SOLR: 4000.0000 mL | Freq: Once | ORAL | 0 refills | Status: AC

## 2019-09-25 NOTE — Telephone Encounter (Signed)
Called pt. She is aware of her u/s appt. She is scheduled for procedure 12/17 at 2:15pm. Patient aware will mail prep instructions with pre-op/covid-19 testing appt. Confirmed address. rx sent to pharmacy.

## 2019-09-26 LAB — LIPASE: Lipase: 19 U/L (ref 7–60)

## 2019-09-26 LAB — COMPREHENSIVE METABOLIC PANEL
AG Ratio: 2.1 (calc) (ref 1.0–2.5)
ALT: 20 U/L (ref 6–29)
AST: 25 U/L (ref 10–30)
Albumin: 4.4 g/dL (ref 3.6–5.1)
Alkaline phosphatase (APISO): 60 U/L (ref 31–125)
BUN: 11 mg/dL (ref 7–25)
CO2: 26 mmol/L (ref 20–32)
Calcium: 9.4 mg/dL (ref 8.6–10.2)
Chloride: 99 mmol/L (ref 98–110)
Creat: 0.7 mg/dL (ref 0.50–1.10)
Globulin: 2.1 g/dL (calc) (ref 1.9–3.7)
Glucose, Bld: 126 mg/dL (ref 65–139)
Potassium: 3.3 mmol/L — ABNORMAL LOW (ref 3.5–5.3)
Sodium: 138 mmol/L (ref 135–146)
Total Bilirubin: 0.6 mg/dL (ref 0.2–1.2)
Total Protein: 6.5 g/dL (ref 6.1–8.1)

## 2019-09-26 LAB — CBC WITH DIFFERENTIAL/PLATELET
Absolute Monocytes: 660 cells/uL (ref 200–950)
Basophils Absolute: 30 cells/uL (ref 0–200)
Basophils Relative: 0.4 %
Eosinophils Absolute: 90 cells/uL (ref 15–500)
Eosinophils Relative: 1.2 %
HCT: 40.1 % (ref 35.0–45.0)
Hemoglobin: 13.6 g/dL (ref 11.7–15.5)
Lymphs Abs: 1935 cells/uL (ref 850–3900)
MCH: 32.6 pg (ref 27.0–33.0)
MCHC: 33.9 g/dL (ref 32.0–36.0)
MCV: 96.2 fL (ref 80.0–100.0)
MPV: 11.6 fL (ref 7.5–12.5)
Monocytes Relative: 8.8 %
Neutro Abs: 4785 cells/uL (ref 1500–7800)
Neutrophils Relative %: 63.8 %
Platelets: 196 10*3/uL (ref 140–400)
RBC: 4.17 10*6/uL (ref 3.80–5.10)
RDW: 12.9 % (ref 11.0–15.0)
Total Lymphocyte: 25.8 %
WBC: 7.5 10*3/uL (ref 3.8–10.8)

## 2019-09-26 LAB — TSH+FREE T4: TSH W/REFLEX TO FT4: 1.06 mIU/L

## 2019-09-28 ENCOUNTER — Other Ambulatory Visit: Payer: Self-pay

## 2019-09-28 ENCOUNTER — Ambulatory Visit (HOSPITAL_COMMUNITY)
Admission: RE | Admit: 2019-09-28 | Discharge: 2019-09-28 | Disposition: A | Discharge status: Home / Self Care | Payer: BC Managed Care – PPO | Source: Ambulatory Visit | Attending: Gastroenterology | Admitting: Gastroenterology

## 2019-09-28 DIAGNOSIS — R195 Other fecal abnormalities: Secondary | ICD-10-CM | POA: Diagnosis not present

## 2019-09-28 DIAGNOSIS — Z800 Family history of malignant neoplasm of digestive organs: Secondary | ICD-10-CM | POA: Insufficient documentation

## 2019-09-28 DIAGNOSIS — K760 Fatty (change of) liver, not elsewhere classified: Secondary | ICD-10-CM | POA: Diagnosis not present

## 2019-09-28 DIAGNOSIS — K219 Gastro-esophageal reflux disease without esophagitis: Secondary | ICD-10-CM | POA: Insufficient documentation

## 2019-09-28 DIAGNOSIS — R1013 Epigastric pain: Secondary | ICD-10-CM | POA: Diagnosis not present

## 2019-09-28 DIAGNOSIS — R112 Nausea with vomiting, unspecified: Secondary | ICD-10-CM | POA: Insufficient documentation

## 2019-11-12 ENCOUNTER — Encounter: Payer: Self-pay | Admitting: Gastroenterology

## 2019-11-12 NOTE — Progress Notes (Signed)
PATIENT SCHEDULED AND LETTER SENT  °

## 2019-12-07 NOTE — Patient Instructions (Signed)
   Your procedure is scheduled on: 12/13/2019  Report to [facility] at     13:15 PM.  Call this number if you have problems the morning of surgery: [phone]   Remember:              Follow Directions on the letter you received from Your Physician's office regarding the Bowel Prep  :  Take these medicines the morning of surgery with A SIP OF WATER:  Protonix, Zoloft, xanax and Hydrocodone if needed    Do not wear jewelry, make-up or nail polish.    Do not bring valuables to the hospital.  Contacts, dentures or bridgework may not be worn into surgery.  .   Patients discharged the day of surgery will not be allowed to drive home.     Colonoscopy, Adult, Care After This sheet gives you information about how to care for yourself after your procedure. Your health care provider may also give you more specific instructions. If you have problems or questions, contact your health care provider. What can I expect after the procedure? After the procedure, it is common to have:  A small amount of blood in your stool for 24 hours after the procedure.  Some gas.  Mild abdominal cramping or bloating.  Follow these instructions at home: General instructions   For the first 24 hours after the procedure: ? Do not drive or use machinery. ? Do not sign important documents. ? Do not drink alcohol. ? Do your regular daily activities at a slower pace than normal. ? Eat soft, easy-to-digest foods. ? Rest often.  Take over-the-counter or prescription medicines only as told by your health care provider.  It is up to you to get the results of your procedure. Ask your health care provider, or the department performing the procedure, when your results will be ready. Relieving cramping and bloating  Try walking around when you have cramps or feel bloated.  Apply heat to your abdomen as told by your health care provider. Use a heat source that your health care provider recommends, such as a  moist heat pack or a heating pad. ? Place a towel between your skin and the heat source. ? Leave the heat on for 20-30 minutes. ? Remove the heat if your skin turns bright red. This is especially important if you are unable to feel pain, heat, or cold. You may have a greater risk of getting burned. Eating and drinking  Drink enough fluid to keep your urine clear or pale yellow.  Resume your normal diet as instructed by your health care provider. Avoid heavy or fried foods that are hard to digest.  Avoid drinking alcohol for as long as instructed by your health care provider. Contact a health care provider if:  You have blood in your stool 2-3 days after the procedure. Get help right away if:  You have more than a small spotting of blood in your stool.  You pass large blood clots in your stool.  Your abdomen is swollen.  You have nausea or vomiting.  You have a fever.  You have increasing abdominal pain that is not relieved with medicine. This information is not intended to replace advice given to you by your health care provider. Make sure you discuss any questions you have with your health care provider. Document Released: 07/27/2004 Document Revised: 09/06/2016 Document Reviewed: 02/24/2016 Elsevier Interactive Patient Education  [employer].

## 2019-12-11 ENCOUNTER — Encounter (HOSPITAL_COMMUNITY)
Admission: RE | Admit: 2019-12-11 | Discharge: 2019-12-11 | Disposition: A | Discharge status: Home / Self Care | Payer: BC Managed Care – PPO | Source: Ambulatory Visit | Attending: Gastroenterology | Admitting: Gastroenterology

## 2019-12-11 ENCOUNTER — Encounter (HOSPITAL_COMMUNITY): Payer: Self-pay

## 2019-12-11 ENCOUNTER — Other Ambulatory Visit: Payer: Self-pay | Admitting: Gastroenterology

## 2019-12-11 ENCOUNTER — Telehealth: Payer: Self-pay | Admitting: *Deleted

## 2019-12-11 ENCOUNTER — Other Ambulatory Visit (HOSPITAL_COMMUNITY)
Admission: RE | Admit: 2019-12-11 | Discharge: 2019-12-11 | Disposition: A | Discharge status: Home / Self Care | Payer: BC Managed Care – PPO | Source: Ambulatory Visit | Attending: Gastroenterology | Admitting: Gastroenterology

## 2019-12-11 ENCOUNTER — Other Ambulatory Visit: Payer: Self-pay

## 2019-12-11 DIAGNOSIS — R197 Diarrhea, unspecified: Secondary | ICD-10-CM | POA: Diagnosis not present

## 2019-12-11 DIAGNOSIS — K635 Polyp of colon: Secondary | ICD-10-CM | POA: Diagnosis not present

## 2019-12-11 DIAGNOSIS — K621 Rectal polyp: Secondary | ICD-10-CM | POA: Diagnosis not present

## 2019-12-11 DIAGNOSIS — Z01812 Encounter for preprocedural laboratory examination: Secondary | ICD-10-CM | POA: Insufficient documentation

## 2019-12-11 HISTORY — DX: Essential (primary) hypertension: I10

## 2019-12-11 LAB — BASIC METABOLIC PANEL
Anion gap: 13 (ref 5–15)
BUN: 12 mg/dL (ref 6–20)
CO2: 25 mmol/L (ref 22–32)
Calcium: 9.8 mg/dL (ref 8.9–10.3)
Chloride: 99 mmol/L (ref 98–111)
Creatinine, Ser: 0.71 mg/dL (ref 0.44–1.00)
GFR calc Af Amer: >60 mL/min (ref >60)
GFR calc non Af Amer: >60 mL/min (ref >60)
Glucose, Bld: 112 mg/dL — ABNORMAL HIGH (ref 70–99)
Potassium: 2.7 mmol/L — CL (ref 3.5–5.1)
Sodium: 137 mmol/L (ref 135–145)

## 2019-12-11 LAB — HCG, SERUM, QUALITATIVE: Preg, Serum: NEGATIVE

## 2019-12-11 LAB — SARS CORONAVIRUS 2 (TAT 6-24 HRS): SARS Coronavirus 2: NEGATIVE

## 2019-12-11 MED ORDER — POTASSIUM CHLORIDE ER 20 MEQ PO TBCR: EXTENDED_RELEASE_TABLET | ORAL | 0 refills | Status: DC

## 2019-12-11 NOTE — Pre-Procedure Instructions (Signed)
Inter office message sent to Dr Oneida Alar about 2.7 potassium.

## 2019-12-11 NOTE — Telephone Encounter (Signed)
Endo called. Wanting to see if patient wants to move procedure time up for Thursday d/t cancellation. Called pt, goes straight to VM

## 2019-12-12 NOTE — Telephone Encounter (Signed)
Called pt. She did not want to move procedure time up to 8:45am tomorrow. Called endo and made aware

## 2019-12-13 ENCOUNTER — Encounter (HOSPITAL_COMMUNITY): Payer: Self-pay | Admitting: Gastroenterology

## 2019-12-13 ENCOUNTER — Encounter (HOSPITAL_COMMUNITY)
Admission: RE | Disposition: A | Discharge status: Home / Self Care | Payer: Self-pay | Source: Ambulatory Visit | Attending: Gastroenterology

## 2019-12-13 ENCOUNTER — Ambulatory Visit (HOSPITAL_COMMUNITY): Payer: BC Managed Care – PPO | Admitting: Anesthesiology

## 2019-12-13 ENCOUNTER — Other Ambulatory Visit: Payer: Self-pay

## 2019-12-13 ENCOUNTER — Ambulatory Visit (HOSPITAL_COMMUNITY)
Admission: RE | Admit: 2019-12-13 | Discharge: 2019-12-13 | Disposition: A | Discharge status: Home / Self Care | Payer: BC Managed Care – PPO | Source: Ambulatory Visit | Attending: Gastroenterology | Admitting: Gastroenterology

## 2019-12-13 DIAGNOSIS — K219 Gastro-esophageal reflux disease without esophagitis: Secondary | ICD-10-CM | POA: Diagnosis not present

## 2019-12-13 DIAGNOSIS — F17210 Nicotine dependence, cigarettes, uncomplicated: Secondary | ICD-10-CM | POA: Insufficient documentation

## 2019-12-13 DIAGNOSIS — R197 Diarrhea, unspecified: Secondary | ICD-10-CM | POA: Diagnosis not present

## 2019-12-13 DIAGNOSIS — R112 Nausea with vomiting, unspecified: Secondary | ICD-10-CM | POA: Diagnosis not present

## 2019-12-13 DIAGNOSIS — Z800 Family history of malignant neoplasm of digestive organs: Secondary | ICD-10-CM | POA: Insufficient documentation

## 2019-12-13 DIAGNOSIS — I10 Essential (primary) hypertension: Secondary | ICD-10-CM | POA: Diagnosis not present

## 2019-12-13 DIAGNOSIS — K648 Other hemorrhoids: Secondary | ICD-10-CM | POA: Diagnosis not present

## 2019-12-13 DIAGNOSIS — Z79899 Other long term (current) drug therapy: Secondary | ICD-10-CM | POA: Diagnosis not present

## 2019-12-13 DIAGNOSIS — Z86010 Personal history of colonic polyps: Secondary | ICD-10-CM | POA: Diagnosis not present

## 2019-12-13 DIAGNOSIS — F418 Other specified anxiety disorders: Secondary | ICD-10-CM | POA: Diagnosis not present

## 2019-12-13 DIAGNOSIS — K635 Polyp of colon: Secondary | ICD-10-CM | POA: Diagnosis not present

## 2019-12-13 DIAGNOSIS — Q438 Other specified congenital malformations of intestine: Secondary | ICD-10-CM | POA: Insufficient documentation

## 2019-12-13 DIAGNOSIS — K621 Rectal polyp: Secondary | ICD-10-CM

## 2019-12-13 HISTORY — PX: BIOPSY: SHX5522

## 2019-12-13 HISTORY — PX: COLONOSCOPY WITH PROPOFOL: SHX5780

## 2019-12-13 LAB — POCT I-STAT, CHEM 8
BUN: 7 mg/dL (ref 6–20)
Calcium, Ion: 1.12 mmol/L — ABNORMAL LOW (ref 1.15–1.40)
Chloride: 107 mmol/L (ref 98–111)
Creatinine, Ser: 0.6 mg/dL (ref 0.44–1.00)
Glucose, Bld: 97 mg/dL (ref 70–99)
HCT: 41 % (ref 36.0–46.0)
Hemoglobin: 13.9 g/dL (ref 12.0–15.0)
Potassium: 4.2 mmol/L (ref 3.5–5.1)
Sodium: 140 mmol/L (ref 135–145)
TCO2: 23 mmol/L (ref 22–32)

## 2019-12-13 SURGERY — COLONOSCOPY WITH PROPOFOL
Anesthesia: General

## 2019-12-13 MED ORDER — KETAMINE HCL 50 MG/5ML IJ SOSY
PREFILLED_SYRINGE | INTRAMUSCULAR | Status: AC
  Filled 2019-12-13: qty 5

## 2019-12-13 MED ORDER — LABETALOL HCL 5 MG/ML IV SOLN
INTRAVENOUS | Status: AC
  Filled 2019-12-13: qty 4

## 2019-12-13 MED ORDER — LIDOCAINE HCL (CARDIAC) PF 50 MG/5ML IV SOSY
PREFILLED_SYRINGE | INTRAVENOUS | Status: DC | PRN
  Administered 2019-12-13: 60 mg via INTRAVENOUS

## 2019-12-13 MED ORDER — KETAMINE HCL 10 MG/ML IJ SOLN
INTRAMUSCULAR | Status: DC | PRN
  Administered 2019-12-13: 20 mg via INTRAVENOUS
  Administered 2019-12-13: 10 mg via INTRAVENOUS

## 2019-12-13 MED ORDER — PROPOFOL 10 MG/ML IV BOLUS
INTRAVENOUS | Status: DC | PRN
  Administered 2019-12-13: 20 ug via INTRAVENOUS

## 2019-12-13 MED ORDER — LACTATED RINGERS IV SOLN: INTRAVENOUS | Status: DC

## 2019-12-13 MED ORDER — STERILE WATER FOR IRRIGATION IR SOLN
Status: DC | PRN
  Administered 2019-12-13: 16:00:00 1.5 mL

## 2019-12-13 MED ORDER — PROPOFOL 500 MG/50ML IV EMUL
INTRAVENOUS | Status: DC | PRN
  Administered 2019-12-13: 200 ug/kg/min via INTRAVENOUS

## 2019-12-13 MED ORDER — CHLORHEXIDINE GLUCONATE CLOTH 2 % EX PADS: 6.0000 | MEDICATED_PAD | Freq: Once | CUTANEOUS | Status: DC

## 2019-12-13 MED ORDER — LABETALOL HCL 5 MG/ML IV SOLN
INTRAVENOUS | Status: DC | PRN
  Administered 2019-12-13: 10 mg via INTRAVENOUS

## 2019-12-13 NOTE — Op Note (Signed)
[facility] Patient Name: [name] Procedure Date: 12/13/2019 2:27 PM MRN: [id number] Date of Birth: 10/11/1976 Attending MD: Barney Drain MD, MD CSN: [id number] Age: 43 Admit Type: Outpatient Procedure:                Colonoscopy WITH COLD FORCEPS BIOPSY/SNARE                            POLYPECTOMY Indications:              Clinically significant diarrhea of unexplained                            origin. TAKING CRESTORFOR 2 YRS. USES ZOLOFT. Providers:                Barney Drain MD, MD, Hinton Rao, RN, Randa Spike, Technician Referring MD:             Jasper Loser. Luan Pulling MD, MD Medicines:                Propofol per Anesthesia Complications:            No immediate complications. Estimated Blood Loss:     Estimated blood loss was minimal. Procedure:                Pre-Anesthesia Assessment:                           - Prior to the procedure, a History and Physical                            was performed, and patient medications and                            allergies were reviewed. The patient's tolerance of                            previous anesthesia was also reviewed. The risks                            and benefits of the procedure and the sedation                            options and risks were discussed with the patient.                            All questions were answered, and informed consent                            was obtained. Prior Anticoagulants: The patient has                            taken no previous anticoagulant or antiplatelet  agents. ASA Grade Assessment: II - A patient with                            mild systemic disease. After reviewing the risks                            and benefits, the patient was deemed in                            satisfactory condition to undergo the procedure.                            After obtaining informed consent, the colonoscope              was passed under direct vision. Throughout the                            procedure, the patient's blood pressure, pulse, and                            oxygen saturations were monitored continuously. The                            [id number]-[id number]) scope was introduced through                            the anus and advanced to the 10 cm into the ileum.                            The colonoscopy was somewhat difficult due to a                            tortuous colon and the patient's agitation.                            Successful completion of the procedure was aided by                            increasing the dose of sedation medication,                            straightening and shortening the scope to obtain                            bowel loop reduction and COLOWRAP. The patient                            tolerated the procedure fairly well. The quality of                            the bowel preparation was excellent. The terminal  ileum, ileocecal valve, appendiceal orifice, and                            rectum were photographed. Scope In: 3:28:00 PM Scope Out: 3:53:15 PM Scope Withdrawal Time: 0 hours 22 minutes 29 seconds  Total Procedure Duration: 0 hours 25 minutes 15 seconds  Findings:      The terminal ileum appeared normal.      Four sessile polyps were found in the rectum, sigmoid colon and       transverse colon. The polyps were 2 to 6 mm in size. These polyps were       removed with a cold snare. Resection and retrieval were complete.      The recto-sigmoid colon and sigmoid colon were mildly tortuous. Biopsies       for histology were taken with a cold forceps from the entire colon for       evaluation of microscopic colitis.      Internal hemorrhoids were found. The hemorrhoids were small. Impression:               - NO OBVIOUS SOURCE FOR DIARRHEA IDENTIFIED.                           - Four 2 to 6 mm polyps in the  rectum, in the                            sigmoid colon and in the transverse colon, removed                            with a cold snare. Resected and retrieved.                           - Tortuous LEFT colon. Biopsied.                           - Internal hemorrhoids. Moderate Sedation:      Per Anesthesia Care Recommendation:           - Patient has a contact number available for                            emergencies. The signs and symptoms of potential                            delayed complications were discussed with the                            patient. Return to normal activities tomorrow.                            Written discharge instructions were provided to the                            patient.                           - LOW FODMAP DIET.                           -  Continue present medications.                           - Await pathology results.                           - Repeat colonoscopy in 5 years for surveillance.                           - Return to GI office in 4 months. Procedure Code(s):        --- Professional ---                           [phone], Colonoscopy, flexible; with removal of                            tumor(s), polyp(s), or other lesion(s) by snare                            technique                           45380, 30, Colonoscopy, flexible; with biopsy,                            single or multiple Diagnosis Code(s):        --- Professional ---                           K64.8, Other hemorrhoids                           K62.1, Rectal polyp                           K63.5, Polyp of colon                           R19.7, Diarrhea, unspecified                           Q43.8, Other specified congenital malformations of                            intestine CPT copyright 2019 American Medical Association. All rights reserved. The codes documented in this report are preliminary and upon coder review may  be revised to meet current compliance  requirements. Barney Drain, MD Barney Drain MD, MD 12/13/2019 4:11:20 PM This report has been signed electronically. Number of Addenda: 0

## 2019-12-13 NOTE — Addendum Note (Signed)
Addendum  created 12/13/19 1614 by Mickel Baas, CRNA   Intraprocedure Event edited

## 2019-12-13 NOTE — Anesthesia Preprocedure Evaluation (Signed)
Anesthesia Evaluation  Patient identified by MRN, date of birth, ID band Patient awake    Reviewed: Allergy & Precautions, NPO status , Patient's Chart, lab work & pertinent test results  Airway Mallampati: I  TM Distance: >3 FB Neck ROM: Full    Dental no notable dental hx. (+) Teeth Intact   Pulmonary neg pulmonary ROS, Current SmokerPatient did not abstain from smoking.,    Pulmonary exam normal breath sounds clear to auscultation       Cardiovascular Exercise Tolerance: Good hypertension, negative cardio ROS Normal cardiovascular examI Rhythm:Regular Rate:Normal     Neuro/Psych Anxiety Depression negative neurological ROS  negative psych ROS   GI/Hepatic Neg liver ROS, Bowel prep,GERD  Medicated and Controlled,Here for colonoscopy for diarrhea   Endo/Other  negative endocrine ROS  Renal/GU negative Renal ROS  negative genitourinary   Musculoskeletal negative musculoskeletal ROS (+)   Abdominal   Peds negative pediatric ROS (+)  Hematology negative hematology ROS (+)   Anesthesia Other Findings   Reproductive/Obstetrics negative OB ROS                             Anesthesia Physical Anesthesia Plan  ASA: II  Anesthesia Plan: General   Post-op Pain Management:    Induction: Intravenous  PONV Risk Score and Plan: 2 and TIVA, Propofol infusion and Treatment may vary due to age or medical condition  Airway Management Planned: Simple Face Mask and Nasal Cannula  Additional Equipment:   Intra-op Plan:   Post-operative Plan: Extubation in OR  Informed Consent: I have reviewed the patients History and Physical, chart, labs and discussed the procedure including the risks, benefits and alternatives for the proposed anesthesia with the patient or authorized representative who has indicated his/her understanding and acceptance.     Dental advisory given  Plan Discussed with:  CRNA  Anesthesia Plan Comments: (Plan Full PPE use  Plan GA with GETA as needed d/w pt -WTP with same after Q&A)        Anesthesia Quick Evaluation

## 2019-12-13 NOTE — Anesthesia Procedure Notes (Signed)
Procedure Name: General with mask airway Performed by: Andree Elk, Avon Molock A, CRNA Pre-anesthesia Checklist: Timeout performed, Suction available, Patient being monitored, Emergency Drugs available and Patient identified Patient Re-evaluated:Patient Re-evaluated prior to induction Oxygen Delivery Method: Non-rebreather mask

## 2019-12-13 NOTE — Transfer of Care (Signed)
Immediate Anesthesia Transfer of Care Note  Patient: [name]  Procedure(s) Performed: COLONOSCOPY WITH PROPOFOL (N/A ) BIOPSY  Patient Location: PACU  Anesthesia Type:General  Level of Consciousness: awake, alert , oriented and patient cooperative  Airway & Oxygen Therapy: Patient Spontanous Breathing  Post-op Assessment: Report given to RN and Post -op Vital signs reviewed and stable  Post vital signs: Reviewed and stable  Last Vitals:  Vitals Value Taken Time  BP    Temp    Pulse    Resp    SpO2      Last Pain:  Vitals:   12/13/19 1518  TempSrc:   PainSc: 0-No pain      Patients Stated Pain Goal: 4 ([id number])  Complications: No apparent anesthesia complications

## 2019-12-13 NOTE — H&P (Signed)
Primary Care Physician:  Sinda Du, MD Primary Gastroenterologist:  Dr. Oneida Alar  Pre-Procedure History & Physical: HPI:  [name] is a 43 y.o. female here for Diarrhea/NAUSEA/VOMITING.  Past Medical History:  Diagnosis Date  . Acute ear infection   . Depression with anxiety   . GERD (gastroesophageal reflux disease)   . Hypertension     Past Surgical History:  Procedure Laterality Date  . BIOPSY N/A 08/27/2014   Procedure: BIOPSY;  Surgeon: Danie Binder, MD;  Location: [facility];  Service: Endoscopy;  Laterality: N/A;  DUODENAL AND GASTRIC  . COLONOSCOPY N/A 07/30/2014   Dr. Oneida Alar: 2 colon polyps removed, small internal hemorrhoids.  Random colon biopsies negative.  Colon polyps benign.  Next colonoscopy in 5 years due to family history  . endometrial ablation    . ENDOMETRIAL ABLATION    . ESOPHAGOGASTRODUODENOSCOPY (EGD) WITH PROPOFOL N/A 08/27/2014   Dr. Oneida Alar: Small hiatal hernia, mild nonerosive gastritis.  Small bowel biopsy negative for celiac.  Gastric biopsy benign with no evidence of H. pylori.  . TOOTH EXTRACTION Bilateral 2015   molars  . TUBAL LIGATION      Prior to Admission medications   Medication Sig Start Date End Date Taking? Authorizing Provider  ALPRAZolam Duanne Moron) 1 MG tablet Take 1 mg by mouth 3 (three) times daily.   Yes [provider]  dicyclomine (BENTYL) 10 MG capsule Take 1 capsule (10 mg total) by mouth 4 (four) times daily -  before meals and at bedtime. As needed for diarrhea, abdominal cramps 09/21/19  Yes Mahala Menghini, PA-C  hydrochlorothiazide (HYDRODIURIL) 12.5 MG tablet Take 1 tablet by mouth 2 (two) times daily. 09/08/19  Yes [provider]  HYDROcodone-acetaminophen (NORCO) 10-325 MG per tablet Take 1 tablet by mouth every 4 (four) hours as needed for moderate pain.    Yes [provider]  lisinopril (ZESTRIL) 20 MG tablet Take 1 tablet by mouth 2 (two) times daily. 09/08/19  Yes [provider]   methocarbamol (ROBAXIN) 500 MG tablet Take 500 mg by mouth 3 (three) times daily.   Yes [provider]  pantoprazole (PROTONIX) 40 MG tablet Take 1 tablet (40 mg total) by mouth daily before breakfast. 09/21/19  Yes Mahala Menghini, PA-C  potassium chloride 20 MEQ TBCR Take 40 mEq (2 tabs) once on 12/15. Take 2 tabs (40 mEq) three times a day on 12/16. 12/17 take 2 tabs (40 mEq). 12/11/19  Yes Annitta Needs, NP  sertraline (ZOLOFT) 50 MG tablet Take 50 mg by mouth daily.   Yes [provider]  ondansetron (ZOFRAN-ODT) 4 MG disintegrating tablet DISSOLVE ONE TABLET BY MOUTH EVERY 8 HOURS AS NEEDED FOR NAUSEA OR VOMITING. 11/07/14   Annitta Needs, NP  rosuvastatin (CRESTOR) 20 MG tablet Take 1 tablet by mouth daily. 03/24/19   [provider]    Allergies as of 09/25/2019  . (No Known Allergies)    Family History  Problem Relation Age of Onset  . Colon cancer Mother   . Cancer Father        lung  . Celiac disease Neg Hx   . Crohn's disease Neg Hx   . Ulcerative colitis Neg Hx     Social History   Socioeconomic History  . Marital status: Divorced    Spouse name: Not on file  . Number of children: Not on file  . Years of education: Not on file  . Highest education level: Not on file  Occupational History  . Occupation: Scientist, forensic    Comment: [place]  Tobacco Use  . Smoking status: Current Every Day Smoker    Packs/day: 1.00    Years: 22.00    Pack years: 22.00    Types: Cigarettes  . Smokeless tobacco: Never Used  . Tobacco comment: less than a pack  Substance and Sexual Activity  . Alcohol use: Yes    Comment: occ  . Drug use: No  . Sexual activity: Yes    Birth control/protection: Surgical  Other Topics Concern  . Not on file  Social History Narrative  . Not on file   Social Determinants of Health   Financial Resource Strain:   . Difficulty of Paying Living Expenses: Not on file  Food Insecurity:   . Worried About Paediatric nurse in the Last Year: Not on file  . Ran Out of Food in the Last Year: Not on file  Transportation Needs:   . Lack of Transportation (Medical): Not on file  . Lack of Transportation (Non-Medical): Not on file  Physical Activity:   . Days of Exercise per Week: Not on file  . Minutes of Exercise per Session: Not on file  Stress:   . Feeling of Stress : Not on file  Social Connections:   . Frequency of Communication with Friends and Family: Not on file  . Frequency of Social Gatherings with Friends and Family: Not on file  . Attends Religious Services: Not on file  . Active Member of Clubs or Organizations: Not on file  . Attends Archivist Meetings: Not on file  . Marital Status: Not on file  Intimate Partner Violence:   . Fear of Current or Ex-Partner: Not on file  . Emotionally Abused: Not on file  . Physically Abused: Not on file  . Sexually Abused: Not on file    Review of Systems: See HPI, otherwise negative ROS   Physical Exam: BP (!) 151/98   Pulse 96   Temp 98.7 F (37.1 C) (Oral)   Resp 20   Ht 5\' 3"  (1.6 m)   Wt 65.3 kg   BMI 25.51 kg/m  General:   Alert,  pleasant and cooperative in NAD Head:  Normocephalic and atraumatic. Neck:  Supple; Lungs:  Clear throughout to auscultation.    Heart:  Regular rate and rhythm. Abdomen:  Soft, nontender and nondistended. Normal bowel sounds, without guarding, and without rebound.   Neurologic:  Alert and  oriented x4;  grossly normal neurologically.  Impression/Plan:     Diarrhea/NAUSEA/VOMITING  PLAN: TCS/EGD TODAY WITH BIOPSY. DISCUSSED PROCEDURE, BENEFITS, & RISKS: < 1% chance of medication reaction, bleeding, perforation, ASPIRATION, or rupture of spleen/liver requiring surgery to fix it and missed polyps < 1 cm 10-20% of the time.

## 2019-12-13 NOTE — Anesthesia Postprocedure Evaluation (Signed)
Anesthesia Post Note  Patient: [name]  Procedure(s) Performed: COLONOSCOPY WITH PROPOFOL (N/A ) BIOPSY  Patient location during evaluation: PACU Anesthesia Type: General Level of consciousness: awake and alert, oriented and patient cooperative Pain management: pain level controlled Vital Signs Assessment: post-procedure vital signs reviewed and stable Respiratory status: spontaneous breathing Cardiovascular status: stable Postop Assessment: no apparent nausea or vomiting Anesthetic complications: no     Last Vitals:  Vitals:   12/13/19 1413  BP: (!) 151/98  Pulse: 96  Resp: 20  Temp: 37.1 C    Last Pain:  Vitals:   12/13/19 1518  TempSrc:   PainSc: 0-No pain                 ADAMS, AMY A

## 2019-12-17 LAB — SURGICAL PATHOLOGY

## 2019-12-19 NOTE — Discharge Instructions (Signed)
NO SOURCE FOR YOUR DIARRHEA WAS IDENTIFIED.You had 4 small polyps removed. You have SMALL internal hemorrhoids.   DRINK WATER TO KEEP YOUR URINE LIGHT YELLOW.  FOLLOW A LOW FODMAP DIET. AVOID ITEMS THAT CAUSE BLOATING & GAS. SEE INFO BELOW.   TAKE DICYCLOMINE 10 MG TABLETS ONE OR TWO 30 MINUTES PRIOR MEALS AND AT BEDTIME. IT MAY CAUSE DROWSINESS, DRY EYES/MOUTH, BLURRY VISION, OR DIFFICULTY URINATING.  USE PREPARATION H FOUR TIMES  A DAY IF NEEDED TO RELIEVE RECTAL PAIN/PRESSURE/BLEEDING.   YOUR BIOPSY RESULTS WILL BE BACK IN 5 BUSINESS DAYS.  YOUR next colonoscopy WILL BE SCHEDULED BASED ON YOUR FINAL PATHOLOGY REPORT.    Colonoscopy Care After Read the instructions outlined below and refer to this sheet in the next week. These discharge instructions provide you with general information on caring for yourself after you leave the hospital. While your treatment has been planned according to the most current medical practices available, unavoidable complications occasionally occur. If you have any problems or questions after discharge, call DR. Rabecka Brendel, [phone].  ACTIVITY  You may resume your regular activity, but move at a slower pace for the next 24 hours.   Take frequent rest periods for the next 24 hours.   Walking will help get rid of the air and reduce the bloated feeling in your belly (abdomen).   No driving for 24 hours (because of the medicine (anesthesia) used during the test).   You may shower.   Do not sign any important legal documents or operate any machinery for 24 hours (because of the anesthesia used during the test).    NUTRITION  Drink plenty of fluids.   You may resume your normal diet as instructed by your doctor.   Begin with a light meal and progress to your normal diet. Heavy or fried foods are harder to digest and may make you feel sick to your stomach (nauseated).   Avoid alcoholic beverages for 24 hours or as instructed.     MEDICATIONS  You may resume your normal medications.   WHAT YOU CAN EXPECT TODAY  Some feelings of bloating in the abdomen.   Passage of more gas than usual.   Spotting of blood in your stool or on the toilet paper  .  IF YOU HAD POLYPS REMOVED DURING THE COLONOSCOPY:  Eat a soft diet IF YOU HAVE NAUSEA, BLOATING, ABDOMINAL PAIN, OR VOMITING.    FINDING OUT THE RESULTS OF YOUR TEST Not all test results are available during your visit. DR. Oneida Alar WILL CALL YOU WITHIN 14 DAYS OF YOUR PROCEDUE WITH YOUR RESULTS. Do not assume everything is normal if you have not heard from DR. Shalena Ezzell, CALL HER OFFICE AT [phone].  SEEK IMMEDIATE MEDICAL ATTENTION AND CALL THE OFFICE: [phone] IF:  You have more than a spotting of blood in your stool.   Your belly is swollen (abdominal distention).   You are nauseated or vomiting.   You have a temperature over 101F.   You have abdominal pain or discomfort that is severe or gets worse throughout the day.   High-Fiber Diet A high-fiber diet changes your normal diet to include more whole grains, legumes, fruits, and vegetables. Changes in the diet involve replacing refined carbohydrates with unrefined foods. The calorie level of the diet is essentially unchanged. The Dietary Reference Intake (recommended amount) for adult males is 38 grams per day. For adult females, it is 25 grams per day. Pregnant and lactating women should consume 28 grams of fiber per  day. Fiber is the intact part of a plant that is not broken down during digestion. Functional fiber is fiber that has been isolated from the plant to provide a beneficial effect in the body.  PURPOSE  Increase stool bulk.   Ease and regulate bowel movements.   Lower cholesterol.   REDUCE RISK OF COLON CANCER  INDICATIONS THAT YOU NEED MORE FIBER  Constipation and hemorrhoids.   Uncomplicated diverticulosis (intestine condition) and irritable bowel syndrome.   Weight  management.   As a protective measure against hardening of the arteries (atherosclerosis), diabetes, and cancer.   GUIDELINES FOR INCREASING FIBER IN THE DIET  Start adding fiber to the diet slowly. A gradual increase of about 5 more grams (2 servings of most fruits or vegetables) per day is best. Too rapid an increase in fiber may result in constipation, flatulence, and bloating.   Drink enough water and fluids to keep your urine clear or pale yellow. Water, juice, or caffeine-free drinks are recommended. Not drinking enough fluid may cause constipation.   Eat a variety of high-fiber foods rather than one type of fiber.   Try to increase your intake of fiber through using high-fiber foods rather than fiber pills or supplements that contain small amounts of fiber.   The goal is to change the types of food eaten. Do not supplement your present diet with high-fiber foods, but replace foods in your present diet.    Polyps, Colon  A polyp is extra tissue that grows inside your body. Colon polyps grow in the large intestine. The large intestine, also called the colon, is part of your digestive system. It is a long, hollow tube at the end of your digestive tract where your body makes and stores stool.  Most polyps are not dangerous. They are benign. This means they are not cancerous. But over time, some types of polyps can turn into cancer. Polyps that are smaller than a pea are usually not harmful. But larger polyps could someday become or may already be cancerous. To be safe, doctors remove all polyps and test them.   WHO GETS POLYPS? Anyone can get polyps, but certain people are more likely than others. You may have a greater chance of getting polyps if:  You are over 50.   You have had polyps before.   Someone in your family has had polyps.   Someone in your family has had cancer of the large intestine.   Find out if someone in your family has had polyps. You may also be more likely to  get polyps if you:   Eat a lot of fatty foods   Smoke   Drink alcohol   Do not exercise  Eat too much   PREVENTION There is not one sure way to prevent polyps. You might be able to lower your risk of getting them if you:  Eat more fruits and vegetables and less fatty food.   Do not smoke.   Avoid alcohol.   Exercise every day.   Lose weight if you are overweight.   Eating more calcium and folate can also lower your risk of getting polyps. Some foods that are rich in calcium are milk, cheese, and broccoli. Some foods that are rich in folate are chickpeas, kidney beans, and spinach.

## 2019-12-25 NOTE — Progress Notes (Signed)
Patient scheduled and on recall  °

## 2020-02-12 ENCOUNTER — Ambulatory Visit: Payer: BC Managed Care – PPO | Admitting: Gastroenterology

## 2020-02-12 ENCOUNTER — Telehealth: Payer: Self-pay | Admitting: Gastroenterology

## 2020-02-12 ENCOUNTER — Encounter: Payer: Self-pay | Admitting: Gastroenterology

## 2020-02-12 NOTE — Telephone Encounter (Signed)
Patient was a no show and letter sent  °

## 2020-04-15 ENCOUNTER — Ambulatory Visit: Payer: BC Managed Care – PPO | Admitting: Family Medicine

## 2021-05-19 DIAGNOSIS — M47812 Spondylosis without myelopathy or radiculopathy, cervical region: Secondary | ICD-10-CM | POA: Insufficient documentation

## 2021-05-19 DIAGNOSIS — M47816 Spondylosis without myelopathy or radiculopathy, lumbar region: Secondary | ICD-10-CM | POA: Insufficient documentation

## 2022-01-29 ENCOUNTER — Other Ambulatory Visit (HOSPITAL_COMMUNITY): Payer: Self-pay | Admitting: Family Medicine

## 2022-01-29 ENCOUNTER — Other Ambulatory Visit: Payer: Self-pay | Admitting: Family Medicine

## 2022-01-29 DIAGNOSIS — R059 Cough, unspecified: Secondary | ICD-10-CM

## 2022-01-29 DIAGNOSIS — F17200 Nicotine dependence, unspecified, uncomplicated: Secondary | ICD-10-CM

## 2022-03-19 ENCOUNTER — Other Ambulatory Visit: Payer: Self-pay

## 2022-03-19 ENCOUNTER — Ambulatory Visit (HOSPITAL_COMMUNITY)
Admission: RE | Admit: 2022-03-19 | Discharge: 2022-03-19 | Disposition: A | Discharge status: Home / Self Care | Payer: Medicaid Other | Source: Ambulatory Visit | Attending: Family Medicine | Admitting: Family Medicine

## 2022-03-19 DIAGNOSIS — F17200 Nicotine dependence, unspecified, uncomplicated: Secondary | ICD-10-CM | POA: Diagnosis present

## 2022-03-19 DIAGNOSIS — R059 Cough, unspecified: Secondary | ICD-10-CM | POA: Insufficient documentation

## 2022-03-30 ENCOUNTER — Encounter: Payer: Self-pay | Admitting: Internal Medicine

## 2022-04-21 NOTE — Progress Notes (Signed)
? ?Office Visit Note ? ?Patient: [name]             ?Date of Birth: May 30, 1976           ?MRN: [id number]             ?PCP: Lavella Lemons, PA ?Referring: Tilda Burrow, NP ?Visit Date: 04/22/2022 ?Occupation: Homemaker ? ?Subjective:  ?New Patient (Initial Visit) (Bil hand pain) ? ? ?History of Present Illness: [name] is a 45 y.o. female here for evaluation of joint pains especially bilateral hands with swelling seen in PCP office. She has hand pain for about 10 years constant symptoms with some progressive worsening over time. She notices redness and swelling in the distal finger joints of both hands. She does not take any specific medication for this. Topical diclofenac did not improve her symptoms noticeably. She has some cervical spine arthritis with radiculopathy and has had local steroid injections for this with some improvement. However hand numbness was not affected much and she is referred to [facility] to rule out carpal tunnel syndrome. ? ?Activities of Daily Living:  ?Patient reports morning stiffness for 30 minutes.   ?Patient Reports nocturnal pain.  ?Difficulty dressing/grooming: Denies ?Difficulty climbing stairs: Denies ?Difficulty getting out of chair: Denies ?Difficulty using hands for taps, buttons, cutlery, and/or writing: Reports ? ?Review of Systems  ?Constitutional:  Negative for fatigue.  ?HENT:  Positive for mouth dryness.   ?Eyes:  Positive for dryness.  ?Respiratory:  Positive for shortness of breath.   ?Cardiovascular:  Positive for swelling in legs/feet.  ?Gastrointestinal:  Positive for diarrhea.  ?Endocrine: Positive for cold intolerance, heat intolerance and excessive thirst.  ?Genitourinary:  Negative for difficulty urinating.  ?Musculoskeletal:  Positive for joint pain, joint pain, joint swelling, muscle weakness and morning stiffness.  ?Skin:  Positive for rash.  ?Allergic/Immunologic: Positive for susceptible to infections.  ?Neurological:  Positive for numbness and  weakness.  ?Hematological:  Positive for bruising/bleeding tendency.  ?Psychiatric/Behavioral:  Negative for sleep disturbance.   ? ?PMFS History:  ?Patient Active Problem List  ? Diagnosis Date Noted  ? Bilateral hand pain 04/22/2022  ? Cervical spondylosis 05/19/2021  ? Lumbar spondylosis 05/19/2021  ? Diarrhea   ? N&V (nausea and vomiting) 09/21/2019  ? Abdominal pain, epigastric 09/21/2019  ? Family hx of colon cancer 09/21/2019  ? Well woman exam with routine gynecological exam 11/06/2014  ? GERD (gastroesophageal reflux disease) 09/11/2014  ? Gastritis 09/11/2014  ? Loose stools 07/18/2014  ?  ?Past Medical History:  ?Diagnosis Date  ? Acute ear infection   ? Depression with anxiety   ? GERD (gastroesophageal reflux disease)   ? Hypertension   ?  ?Family History  ?Problem Relation Age of Onset  ? Colon cancer Mother   ? Rheum arthritis Mother   ? Cancer Father   ?     lung  ? Celiac disease Neg Hx   ? Crohn's disease Neg Hx   ? Ulcerative colitis Neg Hx   ? ?Past Surgical History:  ?Procedure Laterality Date  ? BIOPSY N/A 08/27/2014  ? Procedure: BIOPSY;  Surgeon: Danie Binder, MD;  Location: [facility];  Service: Endoscopy;  Laterality: N/A;  DUODENAL AND GASTRIC  ? BIOPSY  12/13/2019  ? Procedure: BIOPSY;  Surgeon: Danie Binder, MD;  Location: [facility];  Service: Endoscopy;;  random colon  ? COLONOSCOPY N/A 07/30/2014  ? Dr. Oneida Alar: 2 colon polyps removed, small internal hemorrhoids.  Random colon  biopsies negative.  Colon polyps benign.  Next colonoscopy in 5 years due to family history  ? COLONOSCOPY WITH PROPOFOL N/A 12/13/2019  ? Procedure: COLONOSCOPY WITH PROPOFOL;  Surgeon: Danie Binder, MD;  Location: [facility];  Service: Endoscopy;  Laterality: N/A;  2:15pm - would not move up, per office  ? endometrial ablation    ? ENDOMETRIAL ABLATION    ? ESOPHAGOGASTRODUODENOSCOPY (EGD) WITH PROPOFOL N/A 08/27/2014  ? Dr. Oneida Alar: Small hiatal hernia, mild nonerosive gastritis.  Small bowel biopsy  negative for celiac.  Gastric biopsy benign with no evidence of H. pylori.  ? TOOTH EXTRACTION Bilateral 2015  ? molars  ? TUBAL LIGATION    ? ?Social History  ? ?Social History Narrative  ? Not on file  ? ? ?There is no immunization history on file for this patient.  ? ?Objective: ?Vital Signs: BP 118/76 (BP Location: Right Arm, Patient Position: Sitting, Cuff Size: Normal)   Pulse (!) 120   Resp 15   Ht '5\' 3"'$  (1.6 m)   Wt 147 lb (66.7 kg)   BMI 26.04 kg/m?   ? ?Physical Exam ?HENT:  ?   Mouth/Throat:  ?   Mouth: Mucous membranes are moist.  ?   Pharynx: Oropharynx is clear.  ?Eyes:  ?   Conjunctiva/sclera: Conjunctivae normal.  ?Cardiovascular:  ?   Rate and Rhythm: Regular rhythm. Tachycardia present.  ?Pulmonary:  ?   Effort: Pulmonary effort is normal.  ?   Breath sounds: Normal breath sounds.  ?Musculoskeletal:  ?   Right lower leg: No edema.  ?   Left lower leg: No edema.  ?Skin: ?   General: Skin is warm and dry.  ?   Comments: Some blanching erythema on upper chest and lower neck  ?Neurological:  ?   Mental Status: She is alert.  ?Psychiatric:     ?   Mood and Affect: Mood normal.  ?  ? ?Musculoskeletal Exam:  ?Neck full ROM no tenderness ?Shoulders full ROM no tenderness or swelling ?Elbows full ROM no tenderness or swelling ?Wrists full ROM no tenderness or swelling ?Fingers full ROM heberdon's nodes present on DIP joints of both hands with slightly flexed position and overlying erythema, mildly tender, no palpable synovitis ?Knees full ROM no tenderness or swelling ?Ankles full ROM no tenderness or swelling ? ?Limited ultrasound inspection of hands with median nerve CSA 0.8 cm? ? ?Investigation: ?No additional findings. ? ?Imaging: ?XR Hand 2 View Left ? ?Result Date: 04/22/2022 ?X-ray left hand 2 views Radiocarpal and carpal joint spaces appear normal.  MCP joint spaces appear normal.  PIPs are normal.  There is mild degenerative appearing changes visible in third DIP with early marginal  osteophytes.  Bone mineralization appears normal.  No erosions are seen. Impression Mild appearing DIP osteoarthritis ? ?XR Hand 2 View Right ? ?Result Date: 04/22/2022 ?X-ray right hand 2 views Radiocarpal and carpal joint spaces appear normal.  MCP joint spaces appear normal.  PIPs are normal.  There is mild degenerative appearing changes visible in second third and fifth DIPs with mild asymmetric joint space narrowing increase sclerosis probable very early marginal osteophytes.  Appear to be few small subchondral cystic appearing changes.  Bone mineralization appears normal.  No erosions are seen. Impression Fairly mild in appearance DIP joint osteoarthritis worst in second and third fingers  ? ?Recent Labs: ?Lab Results  ?Component Value Date  ? WBC 7.5 09/25/2019  ? HGB 13.9 12/13/2019  ? PLT 196 09/25/2019  ?  NA 140 12/13/2019  ? K 4.2 12/13/2019  ? CL 107 12/13/2019  ? CO2 25 12/11/2019  ? GLUCOSE 97 12/13/2019  ? BUN 7 12/13/2019  ? CREATININE 0.60 12/13/2019  ? BILITOT 0.6 09/25/2019  ? ALKPHOS 56 07/18/2014  ? AST 25 09/25/2019  ? ALT 20 09/25/2019  ? PROT 6.5 09/25/2019  ? ALBUMIN 4.5 07/18/2014  ? CALCIUM 9.8 12/11/2019  ? GFRAA >60 12/11/2019  ? ? ?Speciality Comments: No specialty comments available. ? ?Procedures:  ?No procedures performed ?Allergies: Patient has no known allergies.  ? ?Assessment / Plan:     ?Visit Diagnoses: Bilateral hand pain - Plan: XR Hand 2 View Right, XR Hand 2 View Left ? 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  I do not see any erosions or abnormal calcifications to suggest underlying inflammatory problem on x-ray imaging.  Ultrasound inspection did not show any obvious color Doppler enhancement joint swelling and had normal median nerve cross-sectional area.  She has a family history of osteoarthritis and personal history of cigarette smoking risk factors otherwise not sure why she has such advanced symptoms. ?We  discussed treatment options for primary osteoarthritis.  She is not a great candidate for NSAIDs unfortunately due to gastritis history.  Provided printed information for reference as well.  No new prescription medication s

## 2022-04-22 ENCOUNTER — Ambulatory Visit (INDEPENDENT_AMBULATORY_CARE_PROVIDER_SITE_OTHER): Payer: Medicaid Other

## 2022-04-22 ENCOUNTER — Encounter: Payer: Self-pay | Admitting: Internal Medicine

## 2022-04-22 ENCOUNTER — Ambulatory Visit (INDEPENDENT_AMBULATORY_CARE_PROVIDER_SITE_OTHER): Payer: Medicaid Other | Admitting: Internal Medicine

## 2022-04-22 VITALS — BP 118/76 | HR 120 | Resp 15 | Ht 63.0 in | Wt 147.0 lb

## 2022-04-22 DIAGNOSIS — M79641 Pain in right hand: Secondary | ICD-10-CM

## 2022-04-22 DIAGNOSIS — M79642 Pain in left hand: Secondary | ICD-10-CM

## 2022-04-22 NOTE — Patient Instructions (Signed)
For osteoarthritis of the hand several treatments may be beneficial: ?- Topical antiinflammatory medicine such as diclofenac or Voltaren can be applied to  affected area as needed but may be less effective than oral antiinflammatory medicine. Topical analgesics containing CBD, menthol, or lidocaine can be tried. ? ?- Oral nonsteroidal antiinflammatory medication such as ibuprofen, aleve, celebrex, or mobic are very helpful for osteoarthritis but can cause side effects such as stomach ulcers or hypertension with prolonged use. These should be taken intermittently or as needed, and always taken with food. ? ?- Other oral supplements such as glucosamine chondroitin can be helpful for some individuals and have no major side effects. Turmeric has some antiinflammatory effect similar to NSAID medications and may help, if taken as a supplement should not be taken above recommended doses. ? ?- Occupational therapy referral can discuss exercises or activity modification to improve symptoms or strength if needed. ? ?- Local steroid injection is an option if symptoms become worse and not controlled by the above options. ?

## 2022-05-25 ENCOUNTER — Ambulatory Visit: Payer: Medicaid Other | Admitting: Gastroenterology

## 2022-05-26 ENCOUNTER — Encounter: Payer: Self-pay | Admitting: Internal Medicine

## 2022-06-13 IMAGING — CT CT CHEST W/O CM
2 of 4 series · 15 of 36 positions shown, 18 images · non-contrast
Comparison: None.

CLINICAL DATA: Cough for 1 year

History of smoking for 30 years
EXAM:
CT CHEST WITHOUT CONTRAST
TECHNIQUE: Multidetector CT imaging of the chest was performed following the
standard protocol without IV contrast.
RADIATION DOSE REDUCTION: This exam was performed according to the
departmental dose-optimization program which includes automated
exposure control, adjustment of the mA and/or kV according to
patient size and/or use of iterative reconstruction technique.

[Series 2: routine chest without · axial · non-contrast · 0.67mm/px · z∈[+1165,+1443]mm · 12 of 165 slices shown, 15 images]
[im 13/165  mediastinal]
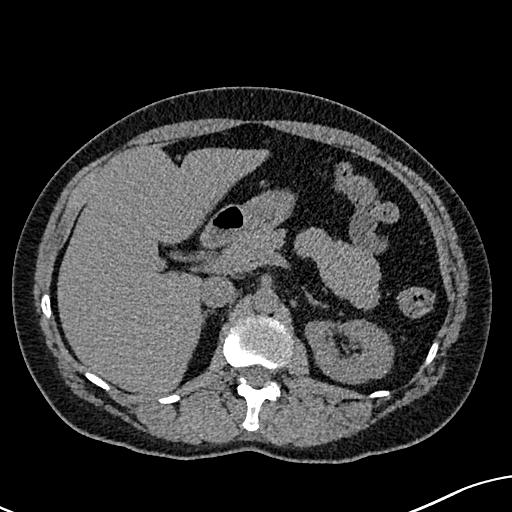
[im 13/165  lung]
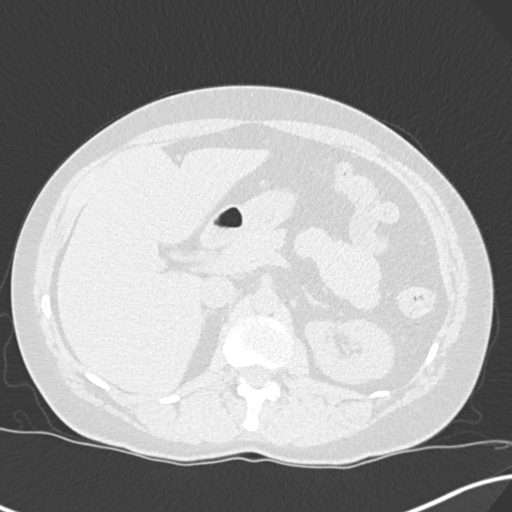
[im 26/165  lung]
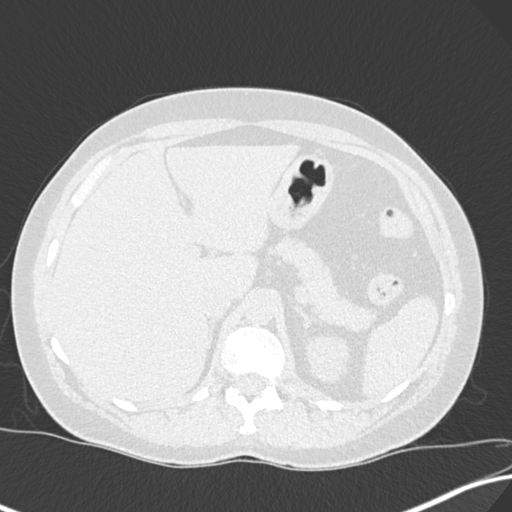
[im 38/165  lung]
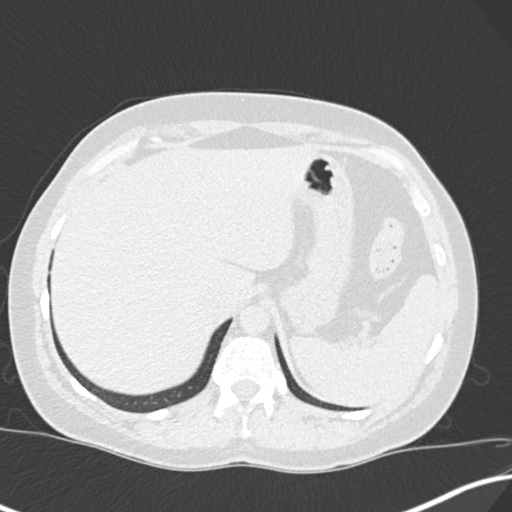
[im 51/165  lung]
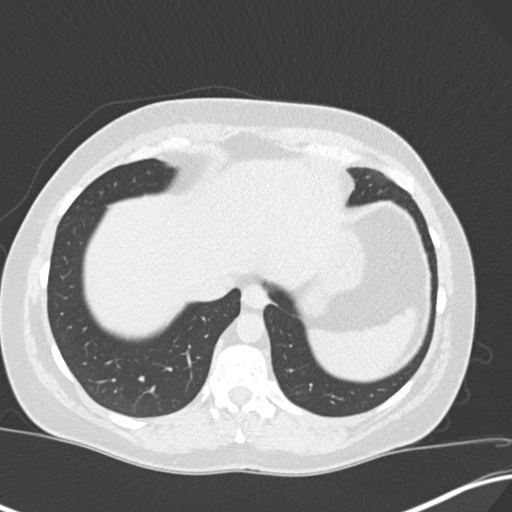
[im 64/165  mediastinal]
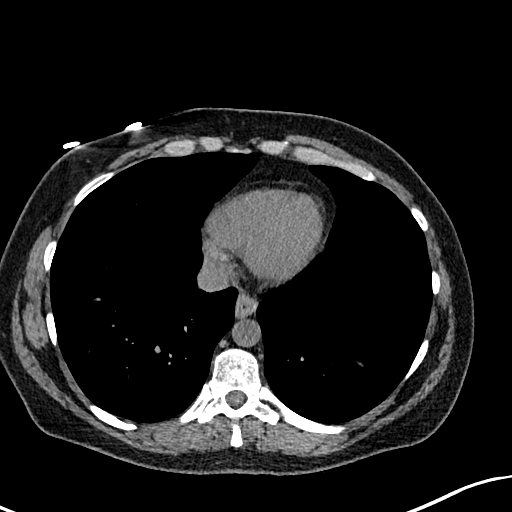
[im 64/165  lung]
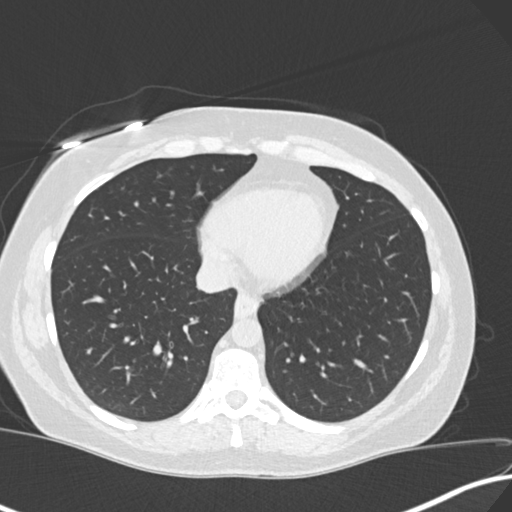
[im 76/165  lung]
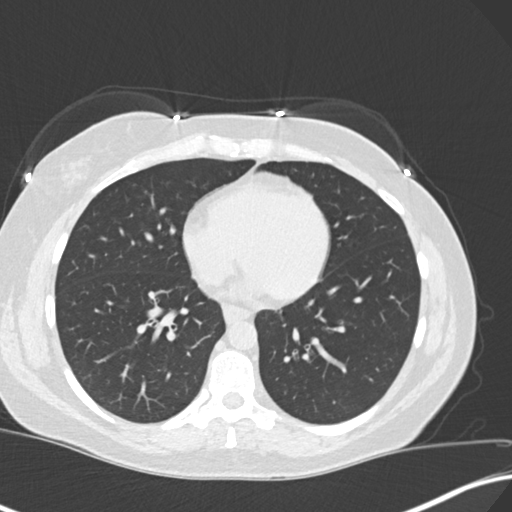
[im 89/165  lung]
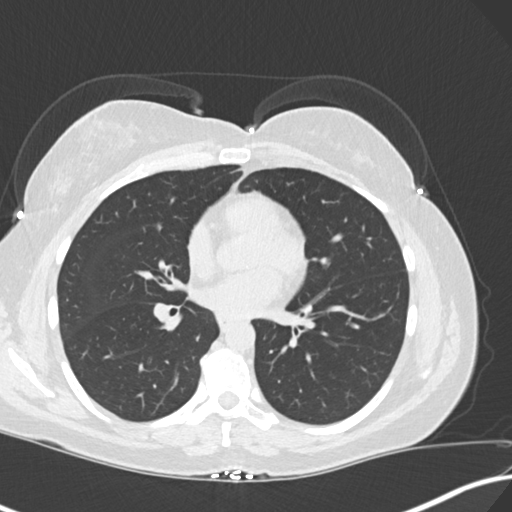
[im 101/165  lung]
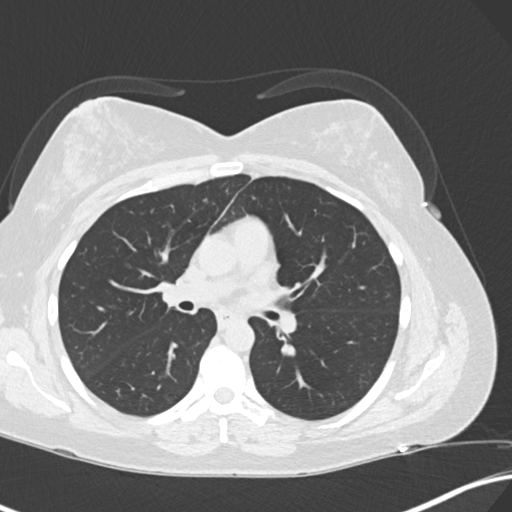
[im 114/165  mediastinal]
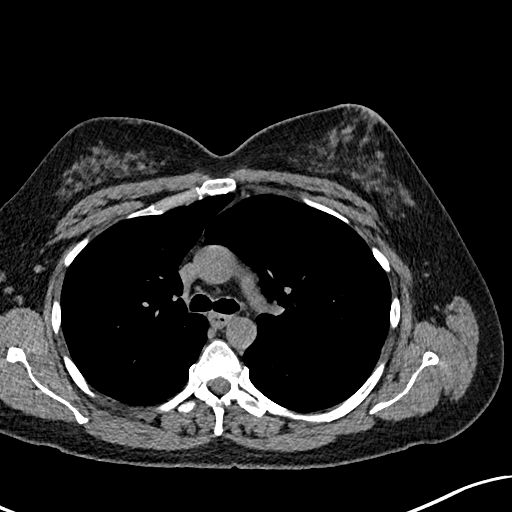
[im 114/165  lung]
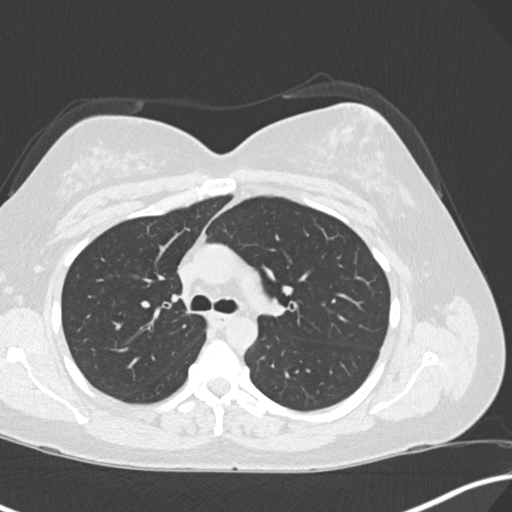
[im 127/165  lung]
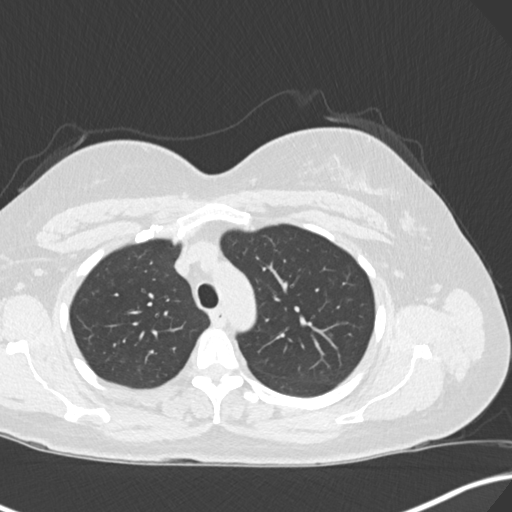
[im 139/165  lung]
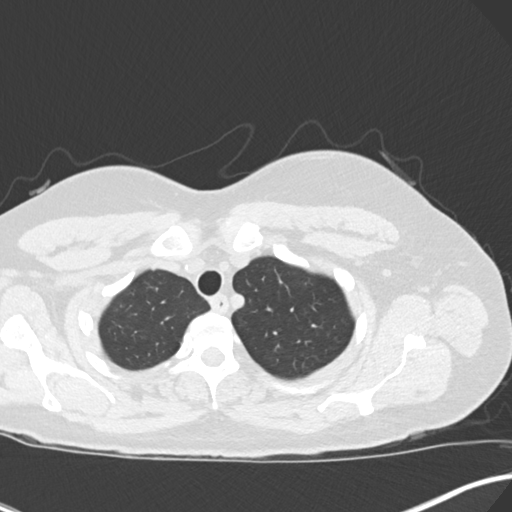
[im 152/165  lung]
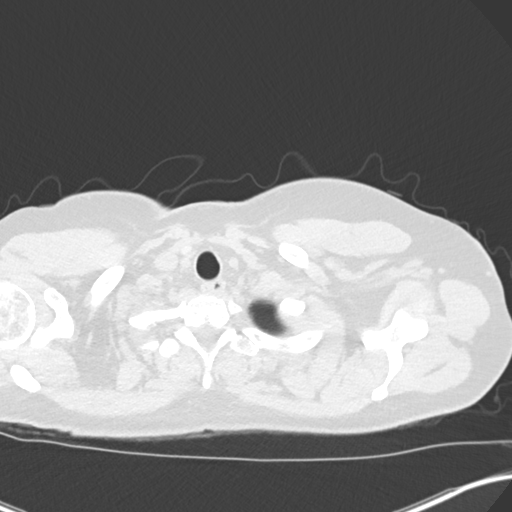

[Series 5: coronal · coronal · 0.65mm/px · 3 of 151 slices shown]
[im 31/151  lung]
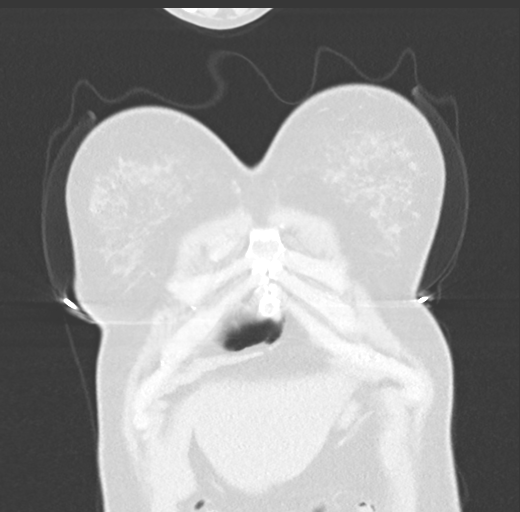
[im 61/151  lung]
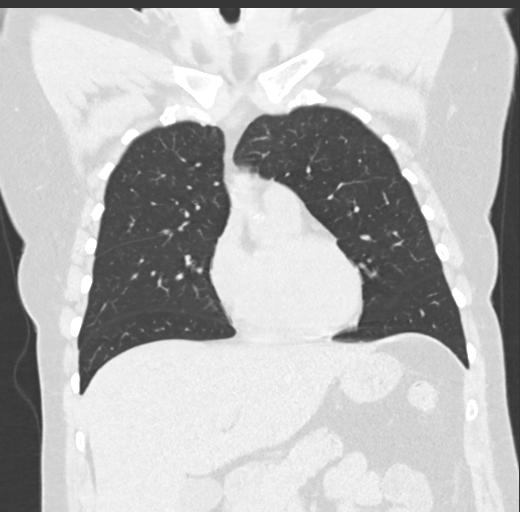
[im 91/151  lung]
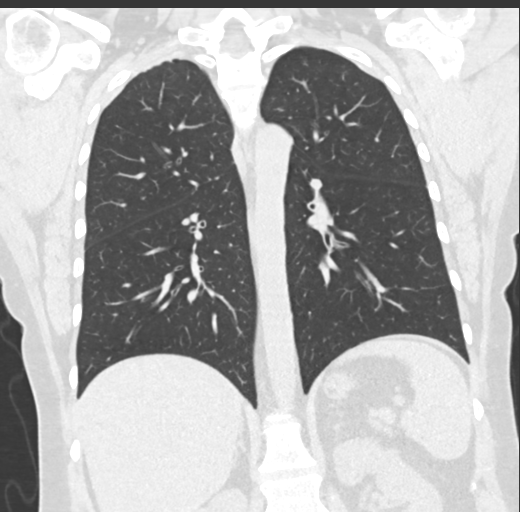

[15 of 36 positions shown; findings below may reference images not displayed]

FINDINGS: Cardiovascular: Heart size within normal limits. Prominent left
epicardial fat pad. Minimal scattered calcified plaque of the aortic
arch and upper abdominal aorta.

Mediastinum/Nodes: No enlarged mediastinal or axillary lymph nodes.
Diffuse thickening of the wall of the distal esophagus.

Lungs/Pleura: Lungs are clear. No pleural effusion or pneumothorax.

Upper Abdomen: No acute abnormality.

Musculoskeletal: No chest wall mass or suspicious bone lesions
identified.
IMPRESSION: 1. No significant abnormality of the lungs.
2. Diffuse thickening of the distal esophageal wall most likely due
to esophagitis. Correlation with endoscopy should be considered to
exclude underlying mass.

Aortic Atherosclerosis (86L14-CSN.N).

## 2023-03-09 ENCOUNTER — Encounter: Payer: Self-pay | Admitting: Internal Medicine

## 2023-03-09 ENCOUNTER — Ambulatory Visit (INDEPENDENT_AMBULATORY_CARE_PROVIDER_SITE_OTHER): Payer: BC Managed Care – PPO | Admitting: Internal Medicine

## 2023-03-09 VITALS — BP 132/78 | HR 100 | Ht 63.0 in | Wt 147.8 lb

## 2023-03-09 DIAGNOSIS — F17210 Nicotine dependence, cigarettes, uncomplicated: Secondary | ICD-10-CM | POA: Diagnosis not present

## 2023-03-09 DIAGNOSIS — R053 Chronic cough: Secondary | ICD-10-CM | POA: Insufficient documentation

## 2023-03-09 LAB — POCT EXHALED NITRIC OXIDE: FeNO level (ppb): 7

## 2023-03-09 MED ORDER — BUDESONIDE-FORMOTEROL FUMARATE 80-4.5 MCG/ACT IN AERO: INHALATION_SPRAY | RESPIRATORY_TRACT | 12 refills | Status: DC

## 2023-03-09 MED ORDER — FAMOTIDINE 20 MG PO TABS: ORAL_TABLET | ORAL | 11 refills | Status: DC

## 2023-03-09 MED ORDER — PREDNISONE 10 MG PO TABS: ORAL_TABLET | ORAL | 0 refills | Status: DC

## 2023-03-09 MED ORDER — BENZONATATE 200 MG PO CAPS: 200.0000 mg | ORAL_CAPSULE | Freq: Three times a day (TID) | ORAL | 1 refills | Status: DC | PRN

## 2023-03-09 NOTE — Patient Instructions (Addendum)
Pantoprazole (protonix) 40 mg   Take  30-60 min before first meal of the day and Pepcid (famotidine)  20 mg after supper until return to office - this is the best way to tell whether stomach acid is contributing to your problem.    GERD (REFLUX)  is an extremely common cause of respiratory symptoms just like yours , many times with no obvious heartburn at all.    It can be treated with medication, but also with lifestyle changes including elevation of the head of your bed (ideally with 6 -8inch blocks under the headboard of your bed),  Smoking cessation, avoidance of late meals, excessive alcohol, and avoid fatty foods, chocolate, peppermint, colas, red wine, and acidic juices such as orange juice.  NO MINT OR MENTHOL PRODUCTS SO NO COUGH DROPS  USE SUGARLESS CANDY INSTEAD (Jolley ranchers or Stover's or Life Savers) or even ice chips will also do - the key is to swallow to prevent all throat clearing. NO OIL BASED VITAMINS - use powdered substitutes.  Avoid fish oil when coughing.   For cough > tessalon 200 mg 4 x  daily   Prednisone 10 mg take  4 each am x 2 days,   2 each am x 2 days,  1 each am x 2 days and stop   Breztri (symbicort 80)  one twice daily thru spacer  Work on inhaler technique:  relax and gently blow all the way out then take a nice smooth full deep breath back in, triggering the inhaler  a split second before you start breathing in thru the spacer.    Hold breath in for at least  5 seconds if you can. Blow out breztri/symbicort  thru nose. Rinse and gargle with water when done.  If mouth or throat bother you at all,  try brushing teeth/gums/tongue with arm and hammer toothpaste/ make a slurry and gargle and spit out.   Please schedule a follow up office visit in 6 weeks, call sooner if needed

## 2023-03-09 NOTE — Assessment & Plan Note (Addendum)
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   These are the same pts (now being labeled as having "irritable larynx syndrome" by some cough centers) who not infrequently have a history of having failed to tolerate ace inhibitors,  dry powder inhalers or biphosphonates or report having atypical/extraesophageal reflux symptoms that don't respond to standard doses of PPI  and are easily confused as having aecopd or asthma flares by even experienced allergists/ pulmonologists (myself included).  Of the three most common causes of  Sub-acute / recurrent or chronic cough, only one (GERD, which we know she has based on CT evidence of esophagitis)  can actually contribute to/ trigger  the other two (asthma and post nasal drip syndrome)  and perpetuate the cylce of cough.  While not intuitively obvious, many patients with chronic low grade reflux do not cough until there is a primary insult that disturbs the protective epithelial barrier and exposes sensitive nerve endings.   This is typically viral but can due to PNDS and  either may apply here.    >>> The point is that once this occurs, it is difficult to  eliminate the cycle using anything but a maximally effective acid suppression regimen at least in the short run, accompanied by an appropriate diet to address non acid GERD and control / eliminate the cough itself for at least 3 days. With tessalon 200/ hard rock candies and >>> also so added 6 day taper off  Prednisone starting at 40 mg per day in case of component of Th-2 driven upper or lower airways inflammation (if cough responds short term only to relapse before return while will on full rx for uacs (as above), then  that would point to allergic rhinitis/ asthma or eos bronchitis as alternative dx)   F/u 6 weeks, call sooner if needed          Each maintenance medication was reviewed in detail including emphasizing most importantly the difference between maintenance and prns and under what circumstances the prns are to be triggered using an action plan format where appropriate.  Total time for H and P, chart review, counseling, reviewing hfa/spacer device(s) and generating customized AVS unique to this office visit / same day charting > 45 min new pt eval

## 2023-03-09 NOTE — Progress Notes (Signed)
[name], female    DOB: 09-22-1976    MRN: [id number]   Brief patient profile:  31  yowf  active smoker  s/p 3 term IUP  referred to pulmonary clinic in [place]  03/09/2023 by Aggie Hacker  for cough since 02/2021 while on lisinopril   Eval by ENT around 2023 no findings / prednisone has helped in past / maint on gabapentin for chronic pain    History of Present Illness  03/09/2023  Pulmonary/ 1st office eval/ [facility] [facility] / [place] on losartan ppi hs and wixella  Chief Complaint  Patient presents with   Consult    Chronic cough    Dyspnea:  housework / limited by back as well as breathing  Cough: worse first thing in am > clear tsp  then all day / assoc with nasal congestion and globus/  Sleep: bed is flat/ 2 pillows / freq awakening due to cough / much better p rx with prednisone SABA use: none  02: none   No obvious day to day or daytime pattern/variability or assoc excess/ purulent sputum or mucus plugs or hemoptysis or cp or chest tightness, subjective wheeze or overt sinus or hb symptoms.     Also denies any obvious fluctuation of symptoms with weather or environmental changes or other aggravating or alleviating factors except as outlined above   No unusual exposure hx or h/o childhood pna/ asthma or knowledge of premature birth.  Current Allergies, Complete Past Medical History, Past Surgical History, Family History, and Social History were reviewed in [employer] record.  ROS  The following are not active complaints unless bolded Hoarseness, sore throat, dysphagia, dental problems, itching, sneezing,  nasal congestion or discharge of excess mucus or purulent secretions, ear ache,   fever, chills, sweats, unintended wt loss or wt gain, classically pleuritic or exertional cp,  orthopnea pnd or arm/hand swelling  or leg swelling, presyncope, palpitations, abdominal pain, anorexia, nausea, vomiting, diarrhea  or change in bowel habits or change  in bladder habits, change in stools or change in urine, dysuria, hematuria,  rash, arthralgias, visual complaints, headache, numbness, weakness or ataxia or problems with walking or coordination,  change in mood or  memory.             Past Medical History:  Diagnosis Date   Acute ear infection    Depression with anxiety    GERD (gastroesophageal reflux disease)    Hypertension     Outpatient Medications Prior to Visit - - NOTE:   Unable to verify as accurately reflecting what pt takes    Medication Sig Dispense Refill   DENTA 5000 PLUS 1.1 % CREA dental cream Take 1 application. by mouth daily.     dicyclomine (BENTYL) 10 MG capsule Take 1 capsule (10 mg total) by mouth 4 (four) times daily -  before meals and at bedtime. As needed for diarrhea, abdominal cramps 120 capsule 5   DULoxetine (CYMBALTA) 30 MG capsule Take 30 mg by mouth 2 (two) times daily.     gabapentin (NEURONTIN) 400 MG capsule Take 400 mg by mouth 3 (three) times daily.     hydrochlorothiazide (HYDRODIURIL) 12.5 MG tablet Take 1 tablet by mouth 2 (two) times daily.     HYDROcodone-acetaminophen (NORCO) 10-325 MG per tablet Take 1 tablet by mouth every 4 (four) hours as needed for moderate pain.  metFORMIN (GLUCOPHAGE) 500 MG tablet Take 500 mg by mouth 2 (two) times daily.     methocarbamol (ROBAXIN) 500 MG tablet Take 500 mg by mouth 3 (three) times daily.     montelukast (SINGULAIR) 10 MG tablet SMARTSIG:1 Tablet(s) By Mouth Every Evening     naproxen (NAPROSYN) 500 MG tablet Take 500 mg by mouth 2 (two) times daily.     ondansetron (ZOFRAN-ODT) 4 MG disintegrating tablet DISSOLVE ONE TABLET BY MOUTH EVERY 8 HOURS AS NEEDED FOR NAUSEA OR VOMITING. 60 tablet 3   pantoprazole (PROTONIX) 40 MG tablet Take 1 tablet (40 mg total) by mouth daily before breakfast. 30 tablet 5   potassium chloride 20 MEQ TBCR Take 40 mEq (2 tabs) once on 12/15. Take 2 tabs (40 mEq) three times a day on 12/16. 12/17 take 2  tabs (40 mEq). 10 tablet 0   RESTASIS 0.05 % ophthalmic emulsion      rosuvastatin (CRESTOR) 20 MG tablet Take 1 tablet by mouth daily.     sertraline (ZOLOFT) 100 MG tablet Take 100 mg by mouth 2 (two) times daily.     sertraline (ZOLOFT) 50 MG tablet Take 50 mg by mouth daily.     traZODone (DESYREL) 150 MG tablet Take 150 mg by mouth at bedtime.     VENTOLIN HFA 108 (90 Base) MCG/ACT inhaler Inhale 2 puffs into the lungs every 4 (four) hours as needed.     ALPRAZolam (XANAX) 1 MG tablet Take 1 mg by mouth 3 (three) times daily. (Patient not taking: Reported on 03/09/2023)     benzonatate (TESSALON) 200 MG capsule Take 200 mg by mouth 3 (three) times daily as needed. (Patient not taking: Reported on 03/09/2023)     pantoprazole (PROTONIX) 20 MG tablet Take 20 mg by mouth daily. (Patient not taking: Reported on 03/09/2023)     No facility-administered medications prior to visit.     Objective:     BP 132/78   Pulse 100   Ht '5\' 3"'$  (1.6 m)   Wt 147 lb 12.8 oz (67 kg)   SpO2 96% Comment: ra  BMI 26.18 kg/m   SpO2: 96 % (ra)  Amb wf almost constantly clearing throat.   HEENT : Oropharynx  clear      Nasal turbinates nl    NECK :  without  apparent JVD/ palpable Nodes/TM    LUNGS: no acc muscle use,  Nl contour chest which is clear to A and P bilaterally without cough on insp or exp maneuvers   CV:  RRR  no s3 or murmur or increase in P2, and no edema   ABD:  soft and nontender with nl inspiratory excursion in the supine position. No bruits or organomegaly appreciated   MS:  Nl gait/ ext warm without deformities Or obvious joint restrictions  calf tenderness, cyanosis or clubbing    SKIN: warm and dry without lesions    NEURO:  alert, approp, nl sensorium with  no motor or cerebellar deficits apparent.    I personally reviewed images and agree with radiology impression as follows:   Chest CT chest 03/19/22   w/o contrast  1. No significant abnormality of the lungs. 2.  Diffuse thickening of the distal esophageal wall most likely due to esophagitis. Correlation with endoscopy should be considered to exclude underlying mass.    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   These are the same pts (now being labeled as having "irritable larynx syndrome" by some cough centers) who not infrequently have a history of having failed to tolerate ace inhibitors,  dry powder inhalers or biphosphonates or report having atypical/extraesophageal reflux symptoms that don't respond to standard doses of PPI  and are easily confused as having aecopd or asthma flares by even experienced allergists/ pulmonologists (myself included).  Of the three most common causes of  Sub-acute / recurrent or chronic cough, only one (GERD, which we know she has based on CT evidence of esophagitis)  can actually contribute to/ trigger  the other two (asthma and post nasal drip syndrome)  and perpetuate the cylce of cough.  While not intuitively obvious, many patients with chronic low grade reflux do not cough until there is a primary insult that disturbs the protective  epithelial barrier and exposes sensitive nerve endings.   This is typically viral but can due to PNDS and  either may apply here.    >>> The point is that once this occurs, it is difficult to eliminate the cycle using anything but a maximally effective acid suppression regimen at least in the short run, accompanied by an appropriate diet to address non acid GERD and control / eliminate the cough itself for at least 3 days. With tessalon 200/ hard rock candies and >>> also so added 6 day taper off  Prednisone starting at 40 mg per day in case of component of Th-2 driven upper or lower airways inflammation (if cough responds short term only to relapse before return while will on full rx for uacs (as above), then  that would point to allergic rhinitis/ asthma or eos bronchitis as alternative dx)   F/u 6 weeks, call sooner if needed          Each maintenance medication was reviewed in detail including emphasizing most importantly the difference between maintenance and prns and under what circumstances the prns are to be triggered using an action plan format where appropriate.  Total time for H and P, chart review, counseling, reviewing hfa/spacer device(s) and generating customized AVS unique to this office visit / same day charting > 45 min new pt eval          Christinia Gully, MD 03/09/2023

## 2023-04-08 ENCOUNTER — Other Ambulatory Visit: Payer: Self-pay | Admitting: Internal Medicine

## 2023-04-08 DIAGNOSIS — R053 Chronic cough: Secondary | ICD-10-CM

## 2023-04-18 NOTE — Progress Notes (Unsigned)
[name], female    DOB: [birth year]    MRN: [id number]   Brief patient profile:  46 yowf  active smoker  s/p 3 term IUP  referred to pulmonary clinic in [name]  03/09/2023 by [name]  for cough since 02/2021 while on lisinopril   Eval by ENT around 2023 no findings / prednisone has helped in past / maint on gabapentin for chronic pain    History of Present Illness  03/09/2023  Pulmonary/ 1st [place] eval/ [name] / [name] on losartan ppi hs and wixella  Chief Complaint  Patient presents with   Consult    Chronic cough    Dyspnea:  housework / limited by back as well as breathing  Cough: worse first thing in am > clear tsp  then all day / assoc with nasal congestion and globus/  Sleep: bed is flat/ 2 pillows / freq awakening due to cough / much better p rx with prednisone SABA use: none  02: none  Rec Pantoprazole (protonix) 40 mg   Take  30-60 min before first meal of the day and Pepcid (famotidine)  20 mg after supper until return to [place]   GERD diet reviewed, bed blocks rec   For cough > tessalon 200 mg 4 x  daily  Prednisone 10 mg take  4 each am x 2 days,   2 each am x 2 days,  1 each am x 2 days and stop  Breztri (symbicort 80)  one twice daily thru spacer Work on inhaler technique:       04/20/2023  f/u ov/[name] re: cough since 02/2021 onset while on lisinopril  maint on pepcid 20 mg p supper  on 400 mg tid  Chief Complaint  Patient presents with   Follow-up    Pt f/u for chronic cough states that her symps are still the same   Dyspnea:  limited by back  Cough: yellowish most productive in am and after supper  Sleeping: not coughing once asleep  SABA use: just symbicort 80 1bid with spacer  02: none      No obvious day to day or daytime variability or assoc mucus plugs or hemoptysis or cp or chest tightness, subjective wheeze or overt sinus or hb symptoms.   Sleeping  without nocturnal  or exacerbation  of respiratory  c/o's  or need for noct saba. Also denies any obvious fluctuation of symptoms with weather or environmental changes or other aggravating or alleviating factors except as outlined above   No unusual exposure hx or h/o childhood pna/ asthma or knowledge of premature birth.  Current Allergies, Complete Past Medical History, Past Surgical History, Family History, and Social History were reviewed in [name] record.  ROS  The following are not active complaints unless bolded Hoarseness, sore throat, dysphagia, dental problems, itching, sneezing,  nasal congestion or discharge of excess mucus or purulent secretions, ear ache,   fever, chills, sweats, unintended wt loss or wt gain, classically pleuritic or exertional cp,  orthopnea pnd or arm/hand swelling  or leg swelling, presyncope, palpitations, abdominal pain, anorexia, nausea, vomiting, diarrhea  or change in bowel habits or change in bladder habits, change in stools or change in urine, dysuria, hematuria,  rash, arthralgias, visual complaints, headache, numbness, weakness or ataxia or problems with walking or coordination,  change in mood or  memory.        Current Meds  Medication Sig   benzonatate (TESSALON) 200 MG capsule  Take 1 capsule (200 mg total) by mouth 3 (three) times daily as needed for cough.   budesonide-formoterol (SYMBICORT) 80-4.5 MCG/ACT inhaler Take puff first thing in am and then another 2 puffs about 12 hours later.   DENTA 5000 PLUS 1.1 % CREA dental cream Take 1 application. by mouth daily.   dicyclomine (BENTYL) 10 MG capsule Take 1 capsule (10 mg total) by mouth 4 (four) times daily -  before meals and at bedtime. As needed for diarrhea, abdominal cramps   DULoxetine (CYMBALTA) 30 MG capsule Take 30 mg by mouth 2 (two) times daily.   famotidine (PEPCID) 20 MG tablet One after supper   gabapentin (NEURONTIN) 400 MG capsule Take 400 mg by mouth 3 (three) times daily.   hydrochlorothiazide (HYDRODIURIL)  12.5 MG tablet Take 1 tablet by mouth 2 (two) times daily.   HYDROcodone-acetaminophen (NORCO) 10-325 MG per tablet Take 1 tablet by mouth every 4 (four) hours as needed for moderate pain.    metFORMIN (GLUCOPHAGE) 500 MG tablet Take 500 mg by mouth 2 (two) times daily.   methocarbamol (ROBAXIN) 500 MG tablet Take 500 mg by mouth 3 (three) times daily.   montelukast (SINGULAIR) 10 MG tablet SMARTSIG:1 Tablet(s) By Mouth Every Evening   naproxen (NAPROSYN) 500 MG tablet Take 500 mg by mouth 2 (two) times daily.   ondansetron (ZOFRAN-ODT) 4 MG disintegrating tablet DISSOLVE ONE TABLET BY MOUTH EVERY 8 HOURS AS NEEDED FOR NAUSEA OR VOMITING.   potassium chloride 20 MEQ TBCR Take 40 mEq (2 tabs) once on 12/15. Take 2 tabs (40 mEq) three times a day on 12/16. 12/17 take 2 tabs (40 mEq).   RESTASIS 0.05 % ophthalmic emulsion    rosuvastatin (CRESTOR) 20 MG tablet Take 1 tablet by mouth daily.   sertraline (ZOLOFT) 100 MG tablet Take 100 mg by mouth 2 (two) times daily.   traZODone (DESYREL) 150 MG tablet Take 150 mg by mouth at bedtime.   [DISCONTINUED] predniSONE (DELTASONE) 10 MG tablet Take  4 each am x 2 days,   2 each am x 2 days,  1 each am x 2 days and stop   [DISCONTINUED] sertraline (ZOLOFT) 50 MG tablet Take 50 mg by mouth daily.               Past Medical History:  Diagnosis Date   Acute ear infection    Depression with anxiety    GERD (gastroesophageal reflux disease)    Hypertension        Objective:     Wt Readings from Last 3 Encounters:  04/20/23 136 lb 3.2 oz (61.8 kg)  03/09/23 147 lb 12.8 oz (67 kg)  04/22/22 147 lb (66.7 kg)      Vital signs reviewed  04/20/2023  - Note at rest 02 sats  98% on RA   General appearance:   amb wf freq throat clearing and harsh dry sounding upper airway cough    HEENT : Oropharynx  clear      Nasal turbinates mod edema s polyps or purulent secretions   NECK :  without  apparent JVD/ palpable Nodes/TM    LUNGS: no acc muscle  use,  Nl contour chest which is clear to A and P bilaterally without cough on insp or exp maneuvers   CV:  RRR  no s3 or murmur or increase in P2, and no edema   ABD:  soft and nontender with nl inspiratory excursion in the supine position. No bruits or organomegaly appreciated  MS:  Nl gait/ ext warm without deformities Or obvious joint restrictions  calf tenderness, cyanosis or clubbing    SKIN: warm and dry without lesions    NEURO:  alert, approp, nl sensorium with  no motor or cerebellar deficits apparent.    CXR PA and Lateral:   04/20/2023 :    I personally reviewed images and impression is as follows:     Minimal increase markings ? RML/ no def as dz     Assessment

## 2023-04-20 ENCOUNTER — Ambulatory Visit (INDEPENDENT_AMBULATORY_CARE_PROVIDER_SITE_OTHER): Payer: BC Managed Care – PPO | Admitting: Internal Medicine

## 2023-04-20 ENCOUNTER — Ambulatory Visit (HOSPITAL_COMMUNITY)
Admission: RE | Admit: 2023-04-20 | Discharge: 2023-04-20 | Disposition: A | Discharge status: Home / Self Care | Payer: BC Managed Care – PPO | Source: Ambulatory Visit | Attending: Internal Medicine | Admitting: Internal Medicine

## 2023-04-20 ENCOUNTER — Encounter: Payer: Self-pay | Admitting: Internal Medicine

## 2023-04-20 VITALS — BP 96/66 | HR 131 | Ht 63.0 in | Wt 136.2 lb

## 2023-04-20 DIAGNOSIS — R0609 Other forms of dyspnea: Secondary | ICD-10-CM | POA: Insufficient documentation

## 2023-04-20 DIAGNOSIS — F17210 Nicotine dependence, cigarettes, uncomplicated: Secondary | ICD-10-CM

## 2023-04-20 DIAGNOSIS — R053 Chronic cough: Secondary | ICD-10-CM | POA: Diagnosis not present

## 2023-04-20 MED ORDER — PREDNISONE 10 MG PO TABS: ORAL_TABLET | ORAL | 0 refills | Status: DC

## 2023-04-20 MED ORDER — AMOXICILLIN-POT CLAVULANATE 875-125 MG PO TABS: 1.0000 | ORAL_TABLET | Freq: Two times a day (BID) | ORAL | 0 refills | Status: DC

## 2023-04-20 MED ORDER — PANTOPRAZOLE SODIUM 40 MG PO TBEC: 40.0000 mg | DELAYED_RELEASE_TABLET | Freq: Every day | ORAL | 2 refills | Status: AC

## 2023-04-20 NOTE — Assessment & Plan Note (Signed)
Counseled re importance of smoking cessation but did not meet time criteria for separate billing           Each maintenance medication was reviewed in detail including emphasizing most importantly the difference between maintenance and prns and under what circumstances the prns are to be triggered using an action plan format where appropriate.  Total time for H and P, chart review, counseling, reviewing hfa device(s) and generating customized AVS unique to this office visit / same day charting > 30 min for    refractory respiratory  symptoms of uncertain etiology       

## 2023-04-20 NOTE — Assessment & Plan Note (Addendum)
Onset 02/2021  - CT  03/19/22 nl x for esophagitis - FENO 03/09/2023  = 7  - 03/09/2023  After extensive coaching inhaler device,  effectiveness =    75% thru spacer so try symb 80 one bid and Prednisone 10 mg take  4 each am x 2 days,   2 each am x 2 days,  1 each am x 2 days and stop plus max gerd rx > 04/20/2023 lungs clear/ harsh upper airway cough with cc lots of nasty sinus drainage> pred x 6 d/ continue singulair, add back ppi ac / autmentin x 10 d and f/u in 4 weeks  - Allergy screen 04/20/2023 >  Eos 0. /  IgE    Response to pred and purulent nasal d/c p takes shower as well as absence of cough on insp or exp measures all point to Upper airway cough syndrome (previously labeled PNDS),  is so named because it's frequently impossible to sort out how much is  CR/sinusitis with freq throat clearing (which can be related to primary GERD)   vs  causing  secondary (" extra esophageal")  GERD from wide swings in gastric pressure that occur with throat clearing, often  promoting self use of mint and menthol lozenges that reduce the lower esophageal sphincter tone and exacerbate the problem further in a cyclical fashion.   These are the same pts (now being labeled as having "irritable larynx syndrome" by some cough centers) who not infrequently have a history of having failed to tolerate ace inhibitors,  dry powder inhalers or biphosphonates or report having atypical/extraesophageal reflux symptoms that don't respond to standard doses of PPI  and are easily confused as having aecopd or asthma flares by even experienced allergists/ pulmonologists (myself included).   The standardized cough guidelines published in Chest by [place] in 2006 are still the best available and consist of a multiple step process (up to 12!) , not a single office visit,  and are intended  to address this problem logically,  with an alogrithm dependent on response to empiric treatment at  each progressive step  to determine a specific  diagnosis with  minimal addtional testing needed. Therefore if adherence is an issue or can't be accurately verified,  it's very unlikely the standard evaluation and treatment will be successful here.    Furthermore, response to therapy (other than acute cough suppression, which should only be used short term with avoidance of narcotic containing cough syrups if possible), can be a gradual process for which the patient is not likely to  perceive immediate benefit.  Unlike going to an eye doctor where the best perscription is almost always the first one and is immediately effective, this is almost never the case in the management of chronic cough syndromes. Therefore the patient needs to commit up front to consistently adhere to recommendations  for up to 6 weeks of therapy directed at the likely underlying problem(s) before the response can be reasonably evaluated.   Rec Stop mints/ continue singulair and add back ppi ac  Augmentin 875 mg take one pill twice daily  X 10 days - take at breakfast and supper with large glass of water.  It would help reduce the usual side effects (diarrhea and yeast infections) if you ate cultured yogurt at lunch.   Prednisone 10 mg take  4 each am x 2 days,   2 each am x 2 days,  1 each am x 2 days and stop   Continue  symbicort 80 one puff  only in am since not likely this is asthma  Please remember to go to the lab department   for your tests - we will call you with the results when they are available.     Please remember to go to the  x-ray department  @  [facility] for your tests - we will call you with the results when they are available     Please schedule a follow up office visit in 4 weeks, sooner if needed  with all meds in hand using a trust but verify approach to confirm accurate Medication  Reconciliation The principal here is that until we are certain that the  patients are doing what we've asked, it makes no sense to ask them to do more.

## 2023-04-20 NOTE — Patient Instructions (Addendum)
Stop all mint products and cigarettes if you can   Resume the Pantoprazole (protonix) 40 mg   Take  30-60 min before first meal of the day and continue Pepcid (famotidine)  20 mg after supper until return to office - this is the best way to tell whether stomach acid is contributing to your problem.    Augmentin 875 mg take one pill twice daily  X 10 days - take at breakfast and supper with large glass of water.  It would help reduce the usual side effects (diarrhea and yeast infections) if you ate cultured yogurt at lunch.   Prednisone 10 mg take  4 each am x 2 days,   2 each am x 2 days,  1 each am x 2 days and stop   Continue symbicort 80 one puff  only in am   Please remember to go to the lab department   for your tests - we will call you with the results when they are available.     Please remember to go to the  x-ray department  @  [facility] for your tests - we will call you with the results when they are available     Please schedule a follow up office visit in 4 weeks, sooner if needed with all meds in hand using a trust but verify approach to confirm accurate Medication  Reconciliation The principal here is that until we are certain that the  patients are doing what we've asked, it makes no sense to ask them to do more.

## 2023-04-25 LAB — CBC WITH DIFFERENTIAL/PLATELET
Basophils Absolute: 0 10*3/uL (ref 0.0–0.2)
Basos: 0 %
EOS (ABSOLUTE): 0 10*3/uL (ref 0.0–0.4)
Eos: 0 %
Hematocrit: 41 % (ref 34.0–46.6)
Hemoglobin: 13.8 g/dL (ref 11.1–15.9)
Immature Grans (Abs): 0 10*3/uL (ref 0.0–0.1)
Immature Granulocytes: 0 %
Lymphocytes Absolute: 2.3 10*3/uL (ref 0.7–3.1)
Lymphs: 24 %
MCH: 33.4 pg — ABNORMAL HIGH (ref 26.6–33.0)
MCHC: 33.7 g/dL (ref 31.5–35.7)
MCV: 99 fL — ABNORMAL HIGH (ref 79–97)
Monocytes Absolute: 0.8 10*3/uL (ref 0.1–0.9)
Monocytes: 9 %
Neutrophils Absolute: 6.4 10*3/uL (ref 1.4–7.0)
Neutrophils: 67 %
Platelets: 287 10*3/uL (ref 150–450)
RBC: 4.13 x10E6/uL (ref 3.77–5.28)
RDW: 14 % (ref 11.7–15.4)
WBC: 9.7 10*3/uL (ref 3.4–10.8)

## 2023-04-25 LAB — BASIC METABOLIC PANEL
BUN/Creatinine Ratio: 12 (ref 9–23)
BUN: 10 mg/dL (ref 6–24)
CO2: 23 mmol/L (ref 20–29)
Calcium: 9.6 mg/dL (ref 8.7–10.2)
Chloride: 96 mmol/L (ref 96–106)
Creatinine, Ser: 0.85 mg/dL (ref 0.57–1.00)
Glucose: 85 mg/dL (ref 70–99)
Potassium: 4.2 mmol/L (ref 3.5–5.2)
Sodium: 138 mmol/L (ref 134–144)
eGFR: 86 mL/min/{1.73_m2} (ref >59)

## 2023-04-25 LAB — TSH: TSH: 1.09 u[IU]/mL (ref 0.450–4.500)

## 2023-04-25 LAB — IGE: IgE (Immunoglobulin E), Serum: 39 IU/mL (ref 6–495)

## 2023-04-25 LAB — D-DIMER, QUANTITATIVE: D-DIMER: 0.21 mg/L FEU (ref 0.00–0.49)

## 2023-04-25 LAB — BRAIN NATRIURETIC PEPTIDE: BNP: 3.2 pg/mL (ref 0.0–100.0)

## 2023-06-01 NOTE — Progress Notes (Deleted)
[name], female    DOB: [birth year]    MRN: [id number]   Brief patient profile:  62 yowf  active smoker  s/p 3 term IUP  referred to pulmonary clinic in [place]  03/09/2023 by [name]  for cough since 02/2021 while on lisinopril   Eval by ENT around 2023 no findings / prednisone has helped in past / maint on gabapentin for chronic pain    History of Present Illness  03/09/2023  Pulmonary/ 1st office eval/ [name] / [name] on losartan ppi hs and wixella  Chief Complaint  Patient presents with   Consult    Chronic cough    Dyspnea:  housework / limited by back as well as breathing  Cough: worse first thing in am > clear tsp  then all day / assoc with nasal congestion and globus/  Sleep: bed is flat/ 2 pillows / freq awakening due to cough / much better p rx with prednisone SABA use: none  02: none  Rec Pantoprazole (protonix) 40 mg   Take  30-60 min before first meal of the day and Pepcid (famotidine)  20 mg after supper until return to office   GERD diet reviewed, bed blocks rec   For cough > tessalon 200 mg 4 x  daily  Prednisone 10 mg take  4 each am x 2 days,   2 each am x 2 days,  1 each am x 2 days and stop  Breztri (symbicort 80)  one twice daily thru spacer Work on inhaler technique:       04/20/2023  f/u ov/[place] office/[name] re: cough since 02/2021 onset while on lisinopril  maint on pepcid 20 mg p supper  on 400 mg tid  Chief Complaint  Patient presents with   Follow-up    Pt f/u for chronic cough states that her symps are still the same   Dyspnea:  limited by back  Cough: yellowish most productive in am and after supper  Sleeping: not coughing once asleep  SABA use: just symbicort 80 1bid with spacer  02: none  Rec Stop all mint products and cigarettes if you can  Resume the Pantoprazole (protonix) 40 mg   Take  30-60 min before first meal of the day and continue Pepcid (famotidine)  20 mg after supper until return to office  Augmentin  875 mg take one pill twice daily  X 10 days Prednisone 10 mg take  4 each am x 2 days,   2 each am x 2 days,  1 each am x 2 days and stop  Continue symbicort 80 one puff  only in am     Please schedule a follow up office visit in 4 weeks, sooner if needed with all meds in hand    Allergy screen 04/20/2023 >  Eos 0.0 /  IgE  39   06/02/2023  f/u ov/[place] office/[name] re: cough x 02/2021  maint on *** did bring meds  No chief complaint on file.   Dyspnea:  *** Cough: *** Sleeping: *** SABA use: *** 02: *** Covid status: *** Lung cancer screening: ***   No obvious day to day or daytime variability or assoc excess/ purulent sputum or mucus plugs or hemoptysis or cp or chest tightness, subjective wheeze or overt sinus or hb symptoms.   *** without nocturnal  or early am exacerbation  of respiratory  c/o's or need for noct saba. Also denies any obvious fluctuation of symptoms with weather or environmental  changes or other aggravating or alleviating factors except as outlined above   No unusual exposure hx or h/o childhood pna/ asthma or knowledge of premature birth.  Current Allergies, Complete Past Medical History, Past Surgical History, Family History, and Social History were reviewed in [name] record.  ROS  The following are not active complaints unless bolded Hoarseness, sore throat, dysphagia, dental problems, itching, sneezing,  nasal congestion or discharge of excess mucus or purulent secretions, ear ache,   fever, chills, sweats, unintended wt loss or wt gain, classically pleuritic or exertional cp,  orthopnea pnd or arm/hand swelling  or leg swelling, presyncope, palpitations, abdominal pain, anorexia, nausea, vomiting, diarrhea  or change in bowel habits or change in bladder habits, change in stools or change in urine, dysuria, hematuria,  rash, arthralgias, visual complaints, headache, numbness, weakness or ataxia or problems with walking or coordination,   change in mood or  memory.        No outpatient medications have been marked as taking for the 06/02/23 encounter (Appointment) with Nyoka Cowden, MD.             Past Medical History:  Diagnosis Date   Acute ear infection    Depression with anxiety    GERD (gastroesophageal reflux disease)    Hypertension        Objective:    wts   06/02/2023         ***   04/20/23 136 lb 3.2 oz (61.8 kg)  03/09/23 147 lb 12.8 oz (67 kg)  04/22/22 147 lb (66.7 kg)    Vital signs reviewed  06/02/2023  - Note at rest 02 sats  ***% on ***   General appearance:    ***             Assessment

## 2023-06-02 ENCOUNTER — Ambulatory Visit: Payer: BC Managed Care – PPO | Admitting: Internal Medicine

## 2023-06-02 DIAGNOSIS — R053 Chronic cough: Secondary | ICD-10-CM

## 2023-06-23 ENCOUNTER — Encounter (HOSPITAL_COMMUNITY): Payer: Self-pay | Admitting: Psychiatry

## 2023-06-23 ENCOUNTER — Ambulatory Visit (INDEPENDENT_AMBULATORY_CARE_PROVIDER_SITE_OTHER): Payer: BC Managed Care – PPO | Admitting: Psychiatry

## 2023-06-23 DIAGNOSIS — Z79899 Other long term (current) drug therapy: Secondary | ICD-10-CM

## 2023-06-23 DIAGNOSIS — F15982 Other stimulant use, unspecified with stimulant-induced sleep disorder: Secondary | ICD-10-CM | POA: Insufficient documentation

## 2023-06-23 DIAGNOSIS — F331 Major depressive disorder, recurrent, moderate: Secondary | ICD-10-CM | POA: Diagnosis not present

## 2023-06-23 DIAGNOSIS — Z789 Other specified health status: Secondary | ICD-10-CM | POA: Insufficient documentation

## 2023-06-23 DIAGNOSIS — F411 Generalized anxiety disorder: Secondary | ICD-10-CM

## 2023-06-23 DIAGNOSIS — F410 Panic disorder [episodic paroxysmal anxiety] without agoraphobia: Secondary | ICD-10-CM

## 2023-06-23 DIAGNOSIS — M47816 Spondylosis without myelopathy or radiculopathy, lumbar region: Secondary | ICD-10-CM

## 2023-06-23 DIAGNOSIS — M47812 Spondylosis without myelopathy or radiculopathy, cervical region: Secondary | ICD-10-CM

## 2023-06-23 DIAGNOSIS — F17210 Nicotine dependence, cigarettes, uncomplicated: Secondary | ICD-10-CM

## 2023-06-23 HISTORY — DX: Other long term (current) drug therapy: Z79.899

## 2023-06-23 MED ORDER — DIVALPROEX SODIUM ER 250 MG PO TB24
250.0000 mg | ORAL_TABLET | Freq: Every evening | ORAL | 0 refills | Status: DC
Start: 2023-06-23 — End: 2023-08-08

## 2023-06-23 MED ORDER — TRAZODONE HCL 150 MG PO TABS
150.0000 mg | ORAL_TABLET | Freq: Every day | ORAL | 2 refills | Status: AC
Start: 2023-06-23 — End: ?

## 2023-06-23 MED ORDER — SERTRALINE HCL 100 MG PO TABS
200.0000 mg | ORAL_TABLET | Freq: Every day | ORAL | 2 refills | Status: AC
Start: 2023-06-23 — End: ?
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
## 2023-06-23 NOTE — Patient Instructions (Signed)
We discontinued the BuSpar and Cymbalta today.  We kept the Zoloft at 200 mg daily and the trazodone at 150 mg nightly.  We added Depakote ER 250 mg nightly to her regimen today.  After you have been on this medication for 3 nights you can come by the clinic and we will get a blood level 12 hours after your nighttime dose.  We will also check a vitamin D, B12, folate, iron panel, CMP, CBC at that time.  I would also recommend reaching out to Bunnie Pion to ask about a nutrition referral given your sporadic meals.  I have also placed a referral for psychotherapy.

## 2023-06-28 ENCOUNTER — Encounter: Payer: Self-pay | Admitting: Internal Medicine

## 2023-06-28 ENCOUNTER — Ambulatory Visit: Payer: BC Managed Care – PPO | Admitting: Internal Medicine

## 2023-06-28 ENCOUNTER — Ambulatory Visit (INDEPENDENT_AMBULATORY_CARE_PROVIDER_SITE_OTHER): Payer: BC Managed Care – PPO | Admitting: Internal Medicine

## 2023-06-28 VITALS — BP 144/89 | HR 99 | Ht 63.0 in | Wt 139.8 lb

## 2023-06-28 DIAGNOSIS — R053 Chronic cough: Secondary | ICD-10-CM

## 2023-06-28 DIAGNOSIS — F17210 Nicotine dependence, cigarettes, uncomplicated: Secondary | ICD-10-CM | POA: Diagnosis not present

## 2023-06-28 DIAGNOSIS — I10 Essential (primary) hypertension: Secondary | ICD-10-CM

## 2023-06-28 MED ORDER — BENZONATATE 200 MG PO CAPS: 200.0000 mg | ORAL_CAPSULE | Freq: Three times a day (TID) | ORAL | 1 refills | Status: DC | PRN

## 2023-06-28 MED ORDER — BISOPROLOL FUMARATE 5 MG PO TABS: 5.0000 mg | ORAL_TABLET | Freq: Every day | ORAL | 11 refills | Status: AC

## 2023-06-28 MED ORDER — PREDNISONE 10 MG PO TABS: ORAL_TABLET | ORAL | 0 refills | Status: DC

## 2023-06-28 NOTE — Progress Notes (Signed)
[name], female    DOB: 1976/04/22    MRN: [id number]   Brief patient profile:  44  yowf  active smoker  s/p 3 term IUP  referred to pulmonary clinic in [place]  03/09/2023 by [name]  for cough since 02/2021 while on lisinopril    Eval by ENT around 2023 no findings / prednisone has helped in past / maint on gabapentin for chronic pain    History of Present Illness  03/09/2023  Pulmonary/ 1st office eval/ Pollie P[facility] / [name] on losartan ppi hs and wixella  Chief Complaint  Patient presents with   Consult    Chronic cough    Dyspnea:  housework / limited by back as well as breathing  Cough: worse first thing in am > clear tsp  then all day / assoc with nasal congestion and globus/  Sleep: bed is flat/ 2 pillows / freq awakening due to cough / much better p rx with prednisone SABA use: none  02: none  Rec Pantoprazole (protonix) 40 mg   Take  30-60 min before first meal of the day and Pepcid (famotidine)  20 mg after supper until return to office   GERD diet reviewed, bed blocks rec   For cough > tessalon 200 mg 4 x  daily  Prednisone 10 mg take  4 each am x 2 days,   2 each am x 2 days,  1 each am x 2 days and stop  Breztri (symbicort 80)  one twice daily thru spacer Work on inhaler technique:       04/20/2023  f/u ov/[place] office/Shataria Crist re: cough since 02/2021 onset while on lisinopril  maint on pepcid 20 mg p supper  on 400 mg tid. Chief Complaint  Patient presents with   Follow-up    Pt f/u for chronic cough states that her symps are still the same   Dyspnea:  limited by back  Cough: yellowish most productive in am and after supper  Sleeping: not coughing once asleep  SABA use: just symbicort 80 1bid with spacer  02: none  Rec Stop all mint products and cigarettes if you can  Resume the Pantoprazole (protonix) 40 mg   Take  30-60 min before first meal of the day and continue Pepcid (famotidine)  20 mg after supper until return to office   Augmentin 875 mg take one pill twice daily  X 10 days Prednisone 10 mg take  4 each am x 2 days,   2 each am x 2 days,  1 each am x 2 days and stop  Continue symbicort 80 one puff  only in am     Please schedule a follow up office visit in 4 weeks, sooner if needed with all meds in hand    Allergy screen 04/20/2023 >  Eos 0.0 /  IgE  39   06/28/2023  f/u ov/[place] office/Eila Runyan re: cough x 02/2021  maint on symbicort 80 x 2 did not bring any meds  - says on gabapentin 400 tid  Chief Complaint  Patient presents with   Follow-up   Dyspnea:  back/ neck stop before breathing Cough: as soon as eyes open she starts cough / Sleeping: on flat bed, on side / 2 pillows  SABA use: once every  few days  02: none      No obvious day to day or daytime variability or assoc excess/ purulent sputum or mucus plugs or hemoptysis or cp or chest tightness, subjective  wheeze or overt sinus or hb symptoms.   Sleeps  without nocturnal  or early am exacerbation  of respiratory  c/o's or need for noct saba. Also denies any obvious fluctuation of symptoms with weather or environmental changes or other aggravating or alleviating factors except as outlined above   No unusual exposure hx or h/o childhood pna/ asthma or knowledge of premature birth.  Current Allergies, Complete Past Medical History, Past Surgical History, Family History, and Social History were reviewed in [name] record.  ROS  The following are not active complaints unless bolded Hoarseness, sore throat, dysphagia, dental problems, itching, sneezing,  nasal congestion or discharge of excess mucus or purulent secretions, ear ache,   fever, chills, sweats, unintended wt loss or wt gain, classically pleuritic or exertional cp,  orthopnea pnd or arm/hand swelling  or leg swelling, presyncope, palpitations, abdominal pain, anorexia, nausea, vomiting, diarrhea  or change in bowel habits or change in bladder habits, change in  stools or change in urine, dysuria, hematuria,  rash, arthralgias, visual complaints, headache, numbness, weakness or ataxia or problems with walking or coordination,  change in mood or  memory.        Current Meds  Medication Sig   budesonide-formoterol (SYMBICORT) 80-4.5 MCG/ACT inhaler Take puff first thing in am and then another 2 puffs about 12 hours later.   DENTA 5000 PLUS 1.1 % CREA dental cream Take 1 application. by mouth daily.   dicyclomine (BENTYL) 10 MG capsule Take 1 capsule (10 mg total) by mouth 4 (four) times daily -  before meals and at bedtime. As needed for diarrhea, abdominal cramps   divalproex (DEPAKOTE ER) 250 MG 24 hr tablet Take 1 tablet (250 mg total) by mouth at bedtime.   famotidine (PEPCID) 20 MG tablet One after supper   fenofibrate (TRICOR) 145 MG tablet Take 145 mg by mouth daily.   gabapentin (NEURONTIN) 400 MG capsule Take 400 mg by mouth 3 (three) times daily.   hydrochlorothiazide (HYDRODIURIL) 12.5 MG tablet Take 1 tablet by mouth 2 (two) times daily.   losartan (COZAAR) 50 MG tablet Take 50 mg by mouth daily.   metFORMIN (GLUCOPHAGE) 500 MG tablet Take 500 mg by mouth 2 (two) times daily.   montelukast (SINGULAIR) 10 MG tablet SMARTSIG:1 Tablet(s) By Mouth Every Evening   naproxen (NAPROSYN) 500 MG tablet Take 500 mg by mouth 2 (two) times daily.   oxyCODONE-acetaminophen (PERCOCET) 7.5-325 MG tablet Take 1 tablet by mouth every 6 (six) hours as needed for severe pain.   pantoprazole (PROTONIX) 40 MG tablet Take 1 tablet (40 mg total) by mouth daily. Take 30-60 min before first meal of the day   potassium chloride 20 MEQ TBCR Take 40 mEq (2 tabs) once on 12/15. Take 2 tabs (40 mEq) three times a day on 12/16. 12/17 take 2 tabs (40 mEq).   RESTASIS 0.05 % ophthalmic emulsion    rosuvastatin (CRESTOR) 20 MG tablet Take 1 tablet by mouth daily.   sertraline (ZOLOFT) 100 MG tablet Take 2 tablets (200 mg total) by mouth daily.   tiZANidine (ZANAFLEX) 4 MG  tablet Take 1 tablet by mouth every 8 (eight) hours as needed for muscle spasms.   traZODone (DESYREL) 150 MG tablet Take 1 tablet (150 mg total) by mouth at bedtime.             Past Medical History:  Diagnosis Date   Acute ear infection    Depression with anxiety  GERD (gastroesophageal reflux disease)    Hypertension        Objective:    wts   06/28/2023        139   04/20/23 136 lb 3.2 oz (61.8 kg)  03/09/23 147 lb 12.8 oz (67 kg)  04/22/22 147 lb (66.7 kg)    Vital signs reviewed  06/28/2023  - Note at rest 02 sats  97% on RA   General appearance:    amb wf with constant throat clearing.   HEENT : Oropharynx  clear - no pnds     Nasal turbinates nl    NECK :  without  apparent JVD/ palpable Nodes/TM    LUNGS: no acc muscle use,  Nl contour chest which is clear to A and P bilaterally without cough on insp or exp maneuvers   CV:  RRR  no s3 or murmur or increase in P2, and no edema   ABD:  soft and nontender    MS:  Nl gait/ ext warm without deformities Or obvious joint restrictions  calf tenderness, cyanosis or clubbing    SKIN: warm and dry without lesions    NEURO:  alert, approp, nl sensorium with  no motor or cerebellar deficits apparent.              Assessment

## 2023-06-28 NOTE — Assessment & Plan Note (Signed)
Counseled re importance of smoking cessation but did not meet time criteria for separate billing   °

## 2023-06-28 NOTE — Patient Instructions (Addendum)
Try symbicort 80 one each am thru the spacer   Prednisone 10 mg take  4 each am x 2 days,   2 each am x 2 days,  1 each am x 2 days and stop    GERD (REFLUX)  is an extremely common cause of respiratory symptoms just like yours , many times with no obvious heartburn at all.    It can be treated with medication, but also with lifestyle changes including elevation of the head of your bed (ideally with 6 -8inch blocks under the headboard of your bed),  Smoking cessation, avoidance of late meals, excessive alcohol, and avoid fatty foods, chocolate, peppermint, colas, red wine, and acidic juices such as orange juice.  NO MINT OR MENTHOL PRODUCTS SO NO COUGH DROPS  USE SUGARLESS CANDY INSTEAD (Jolley ranchers or Stover's or Life Savers) or even ice chips will also do - the key is to swallow to prevent all throat clearing. NO OIL BASED VITAMINS - use powdered substitutes.  Avoid fish oil when coughing.   Stop losartan and start bisoprol 5 mg one daily in its place  For cough> tessalon 200 mg every 6 hours as needed  Please schedule a follow up office visit in 6 weeks, call sooner if needed with all medications /inhalers/ solutions in hand so we can verify exactly what you are taking. This includes all medications from all doctors and over the counters - PLEASE separate them into two bags:  the ones you take automatically, no matter what, vs the ones you take just when you feel you need them "BAG #2 is UP TO YOU"  - this will really help [place] help you take your medications more effectively.

## 2023-06-28 NOTE — Assessment & Plan Note (Signed)
Onset 02/2021  - CT  03/19/22 nl x for esophagitis - FENO 03/09/2023  = 7  - 03/09/2023  After extensive coaching inhaler device,  effectiveness =    75% thru spacer so try symb 80 one bid and Prednisone 10 mg take  4 each am x 2 days,   2 each am x 2 days,  1 each am x 2 days and stop plus max gerd rx > 04/20/2023 lungs clear/ harsh upper airway cough with cc lots of nasty sinus drainage> pred x 6 d/ continue singulair, add back ppi ac / autmentin x 10 d and f/u in 4 weeks  - Allergy screen 04/20/2023 >  Eos 0.0 /  IgE  39 - 06/28/2023 changed losartan to bisoprolol 5 mg /tessalon/ pred and decreaesed symbicort 80 to one daily   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   These are the same pts (now being labeled as having "irritable larynx syndrome" by some cough centers) who not infrequently have a history of having failed to tolerate ace inhibitors,  dry powder inhalers or biphosphonates or report having atypical/extraesophageal reflux symptoms that don't respond to standard doses of PPI  and are easily confused as having aecopd or asthma flares by even experienced allergists/ pulmonologists (myself included).   Of the three most common causes of  Sub-acute / recurrent or chronic cough, only one (GERD)  can actually contribute to/ trigger  the other two (asthma and post nasal drip syndrome)  and perpetuate the cylce of cough.  While not intuitively obvious, many patients with chronic low grade reflux do not cough until there is a primary insult that disturbs  the protective epithelial barrier and exposes sensitive nerve endings.   This is typically viral but can due to PNDS and  either may apply here.   The point is that once this occurs, it is difficult to eliminate the cycle  using anything but a maximally effective acid suppression regimen at least in the short run, accompanied by an appropriate diet to address non acid GERD and control / eliminate the cough itself for at least 3 days with tessalon >>> also so added 6 day taper off  Prednisone starting at 40 mg per day in case of component of Th-2 driven upper or lower airways inflammation (if cough responds short term only to relapse before return while will on full rx for uacs (as above), then  that would point to allergic rhinitis/ asthma or eos bronchitis as alternative dx)

## 2023-06-28 NOTE — Assessment & Plan Note (Signed)
Changed losartan to bisoprolol 5 mg 06/28/2023   For reasons that may related to vascular permability and nitric oxide pathways but not elevated  bradykinin levels (as seen with  ACEi use) losartan in the generic form has been reported now from mulitple sources  to cause a similar pattern of non-specific  upper airway symptoms as seen with acei.   This has not been reported with exposure to the other ARB's to date, so it seems reasonable for now to try either generic diovan or avapro if ARB needed or use an alternative class altogether.  See:  Dewayne Hatch Allergy Asthma Immunol  2008: 101: p 495-499     >>> d/c losartan         Each maintenance medication was reviewed in detail including emphasizing most importantly the difference between maintenance and prns and under what circumstances the prns are to be triggered using an action plan format where appropriate.  Total time for H and P, chart review, counseling, reviewing hfa device(s) and generating customized AVS unique to this office visit / same day charting > 30 min for  refractory respiratory  symptoms of uncertain etiology

## 2023-06-29 ENCOUNTER — Telehealth: Payer: Self-pay | Admitting: Internal Medicine

## 2023-06-29 NOTE — Telephone Encounter (Signed)
Called and spoke w/ pt she verbalized understanding. NFN att 

## 2023-06-29 NOTE — Telephone Encounter (Signed)
Patient checking on quantity of cough medication. Patient phone number is [phone].

## 2023-07-31 ENCOUNTER — Other Ambulatory Visit: Payer: Self-pay | Admitting: Internal Medicine

## 2023-08-08 ENCOUNTER — Telehealth (INDEPENDENT_AMBULATORY_CARE_PROVIDER_SITE_OTHER): Payer: BC Managed Care – PPO | Admitting: Psychiatry

## 2023-08-08 ENCOUNTER — Encounter (HOSPITAL_COMMUNITY): Payer: Self-pay | Admitting: Psychiatry

## 2023-08-08 DIAGNOSIS — F331 Major depressive disorder, recurrent, moderate: Secondary | ICD-10-CM

## 2023-08-08 DIAGNOSIS — F17210 Nicotine dependence, cigarettes, uncomplicated: Secondary | ICD-10-CM

## 2023-08-08 DIAGNOSIS — F1590 Other stimulant use, unspecified, uncomplicated: Secondary | ICD-10-CM | POA: Diagnosis not present

## 2023-08-08 DIAGNOSIS — F15982 Other stimulant use, unspecified with stimulant-induced sleep disorder: Secondary | ICD-10-CM

## 2023-08-08 DIAGNOSIS — F411 Generalized anxiety disorder: Secondary | ICD-10-CM

## 2023-08-08 DIAGNOSIS — M450 Ankylosing spondylitis of multiple sites in spine: Secondary | ICD-10-CM

## 2023-08-08 DIAGNOSIS — F410 Panic disorder [episodic paroxysmal anxiety] without agoraphobia: Secondary | ICD-10-CM | POA: Diagnosis not present

## 2023-08-08 DIAGNOSIS — Z789 Other specified health status: Secondary | ICD-10-CM

## 2023-08-08 DIAGNOSIS — R630 Anorexia: Secondary | ICD-10-CM
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
## 2023-08-08 NOTE — Patient Instructions (Addendum)
We discontinued the Depakote today to see if this was the cause of the worsening fatigue.  If this does not lead to improvement of your energy level please let me know.  In the meantime please try to coordinate with your PCP to obtain the following blood work: vitamin d, b12, folate, iron panel, cbc, cmp, lipid panel, and A1c along with an EKG for potential start of Abilify versus Seroquel.  Keep up the good work with cutting back on caffeine and you can use this resource to find a quick line to get free nicotine replacement resources: [email or web address]-[email or web address]

## 2023-08-11 ENCOUNTER — Other Ambulatory Visit (HOSPITAL_COMMUNITY): Payer: Self-pay | Admitting: Psychiatry

## 2023-08-11 DIAGNOSIS — F331 Major depressive disorder, recurrent, moderate: Secondary | ICD-10-CM

## 2023-08-11 DIAGNOSIS — M47816 Spondylosis without myelopathy or radiculopathy, lumbar region: Secondary | ICD-10-CM

## 2023-08-11 DIAGNOSIS — M47812 Spondylosis without myelopathy or radiculopathy, cervical region: Secondary | ICD-10-CM

## 2023-08-13 NOTE — Progress Notes (Unsigned)
[name], female    DOB: Dec 01, 1976    MRN: [id number]   Brief patient profile:  55  yowf  active smoker  s/p 3 term IUP  referred to pulmonary clinic in [place]  03/09/2023 by [name]  for cough since 02/2021 while on lisinopril    Eval by ENT around 2023 no findings / prednisone has helped in past / maint on gabapentin for chronic pain    History of Present Illness  03/09/2023  Pulmonary/ 1st office eval/ [name] / [name] on losartan ppi hs and wixella  Chief Complaint  Patient presents with   Consult    Chronic cough    Dyspnea:  housework / limited by back as well as breathing  Cough: worse first thing in am > clear tsp  then all day / assoc with nasal congestion and globus/  Sleep: bed is flat/ 2 pillows / freq awakening due to cough / much better p rx with prednisone SABA use: none  02: none  Rec Pantoprazole (protonix) 40 mg   Take  30-60 min before first meal of the day and Pepcid (famotidine)  20 mg after supper until return to office   GERD diet reviewed, bed blocks rec   For cough > tessalon 200 mg 4 x  daily  Prednisone 10 mg take  4 each am x 2 days,   2 each am x 2 days,  1 each am x 2 days and stop  Breztri (symbicort 80)  one twice daily thru spacer Work on inhaler technique:       04/20/2023  f/u ov/[place] office/[name] re: cough since 02/2021 onset while on lisinopril  maint on pepcid 20 mg p supper  on 400 mg tid. Chief Complaint  Patient presents with   Follow-up    Pt f/u for chronic cough states that her symps are still the same   Dyspnea:  limited by back  Cough: yellowish most productive in am and after supper  Sleeping: not coughing once asleep  SABA use: just symbicort 80 1bid with spacer  02: none  Rec Stop all mint products and cigarettes if you can  Resume the Pantoprazole (protonix) 40 mg   Take  30-60 min before first meal of the day and continue Pepcid (famotidine)  20 mg after supper until return to office   Augmentin 875 mg take one pill twice daily  X 10 days Prednisone 10 mg take  4 each am x 2 days,   2 each am x 2 days,  1 each am x 2 days and stop  Continue symbicort 80 one puff  only in am     Please schedule a follow up office visit in 4 weeks, sooner if needed with all meds in hand    Allergy screen 04/20/2023 >  Eos 0.0 /  IgE  39   06/28/2023  f/u ov/[place] office/[name] re: cough x 02/2021  maint on symbicort 80 x 2 did not bring any meds  - says on gabapentin 400 tid  Chief Complaint  Patient presents with   Follow-up  Dyspnea:  back/ neck stop before breathing Cough: as soon as eyes open she starts cough / Sleeping: on flat bed, on side / 2 pillows  SABA use: once every  few days  02: none  Rec Try symbicort 80 one each am thru the spacer  Prednisone 10 mg take  4 each am x 2 days,   2 each am x 2  days,  1 each am x 2 days and stop  GERD diet reviewed, bed blocks rec  Stop losartan and start bisoprol 5 mg one daily in its place For cough> tessalon 200 mg every 6 hours as needed  Please schedule a follow up office visit in 6 weeks, call sooner if needed with all medications /inhalers/ solutions in hand      08/15/2023  f/u ov/[name] office/[name] re: cough x 02/2021 maint on gabapentin 600 mg tid / breztri one twice daily and still smoking / confused with which inhaler is symbicort / breztri sample to office is still half full and says using bid  Chief Complaint  Patient presents with   Chronic Cough   Dyspnea:  not limited by breathing / housework ok  Cough: better in am / still urge to clear throat daytime but improved from prior  Sleeping: bed is flat on side/ 2 pillows  s resp cc  SABA use: thinks it helps p ex    No obvious day to day or daytime variability or assoc excess/ purulent sputum or mucus plugs or hemoptysis or cp or chest tightness, subjective wheeze or overt sinus or hb symptoms.     Also denies any obvious fluctuation of symptoms with weather or  environmental changes or other aggravating or alleviating factors except as outlined above   No unusual exposure hx or h/o childhood pna/ asthma or knowledge of premature birth.  Current Allergies, Complete Past Medical History, Past Surgical History, Family History, and Social History were reviewed in [name] record.  ROS  The following are not active complaints unless bolded Hoarseness, sore throat, dysphagia, dental problems, itching, sneezing,  nasal congestion or discharge of excess mucus or purulent secretions, ear ache,   fever, chills, sweats, unintended wt loss or wt gain, classically pleuritic or exertional cp,  orthopnea pnd or arm/hand swelling  or leg swelling, presyncope, palpitations, abdominal pain, anorexia, nausea, vomiting, diarrhea  or change in bowel habits or change in bladder habits, change in stools or change in urine, dysuria, hematuria,  rash, arthralgias, visual complaints, headache, numbness, weakness or ataxia or problems with walking or coordination,  change in mood or  memory.        Current Meds  Medication Sig   benzonatate (TESSALON) 200 MG capsule TAKE ONE CAPSULE BY MOUTH THREE TIMES DAILY As needed for cough   bisoprolol (ZEBETA) 5 MG tablet Take 1 tablet (5 mg total) by mouth daily.   budesonide-formoterol (SYMBICORT) 80-4.5 MCG/ACT inhaler Take puff first thing in am and then another 2 puffs about 12 hours later.   DENTA 5000 PLUS 1.1 % CREA dental cream Take 1 application. by mouth daily.   dicyclomine (BENTYL) 10 MG capsule Take 1 capsule (10 mg total) by mouth 4 (four) times daily -  before meals and at bedtime. As needed for diarrhea, abdominal cramps   famotidine (PEPCID) 20 MG tablet One after supper   fenofibrate (TRICOR) 145 MG tablet Take 145 mg by mouth daily.   gabapentin (NEURONTIN) 600 MG tablet Take 600 mg by mouth 3 (three) times daily.   hydrochlorothiazide (HYDRODIURIL) 12.5 MG tablet Take 1 tablet by mouth 2 (two)  times daily.   metFORMIN (GLUCOPHAGE) 500 MG tablet Take 500 mg by mouth 2 (two) times daily.   montelukast (SINGULAIR) 10 MG tablet SMARTSIG:1 Tablet(s) By Mouth Every Evening   naproxen (NAPROSYN) 500 MG tablet Take 500 mg by mouth 2 (two) times daily.   oxyCODONE-acetaminophen (PERCOCET)  7.5-325 MG tablet Take 1 tablet by mouth every 6 (six) hours as needed for severe pain.   pantoprazole (PROTONIX) 40 MG tablet Take 1 tablet (40 mg total) by mouth daily. Take 30-60 min before first meal of the day   RESTASIS 0.05 % ophthalmic emulsion    rosuvastatin (CRESTOR) 20 MG tablet Take 1 tablet by mouth daily.   sertraline (ZOLOFT) 100 MG tablet Take 2 tablets (200 mg total) by mouth daily.   tiZANidine (ZANAFLEX) 4 MG tablet Take 1 tablet by mouth every 8 (eight) hours as needed for muscle spasms.   traZODone (DESYREL) 150 MG tablet Take 1 tablet (150 mg total) by mouth at bedtime.   valACYclovir (VALTREX) 500 MG tablet Take 500 mg by mouth 2 (two) times daily.         Past Medical History:  Diagnosis Date   Acute ear infection    Depression with anxiety    GERD (gastroesophageal reflux disease)    Hypertension        Objective:    wts  08/15/2023       143   06/28/2023        139   04/20/23 136 lb 3.2 oz (61.8 kg)  03/09/23 147 lb 12.8 oz (67 kg)  04/22/22 147 lb (66.7 kg)     Vital signs reviewed  08/15/2023  - Note at rest 02 sats  97% on RA   General appearance:    amb wf freq throat clearing non-productive   HEENT : Oropharynx  clear/ no cobblestoning on pnds          NECK :  without  apparent JVD/ palpable Nodes/TM    LUNGS: no acc muscle use,  Nl contour chest which is clear to A and P bilaterally without cough on insp or exp maneuvers   CV:  RRR  no s3 or murmur or increase in P2, and no edema   ABD:  soft and nontender with nl inspiratory excursion in the supine position. No bruits or organomegaly appreciated   MS:  Nl gait/ ext warm without deformities Or  obvious joint restrictions  calf tenderness, cyanosis or clubbing    SKIN: warm and dry without lesions    NEURO:  alert, approp, nl sensorium with  no motor or cerebellar deficits apparent.          Assessment                    Past Medical History:  Diagnosis Date   Acute ear infection    Depression with anxiety    GERD (gastroesophageal reflux disease)    Hypertension        Objective:    wts  08/15/2023        143   06/28/2023        139   04/20/23 136 lb 3.2 oz (61.8 kg)  03/09/23 147 lb 12.8 oz (67 kg)  04/22/22 147 lb (66.7 kg)    Vital signs reviewed  08/15/2023  - Note at rest 02 sats  97% on RA   General appearance:    somber wf with freq throat clearing    HEENT : Oropharynx  clear      Nasal turbinates nl    NECK :  without  apparent JVD/ palpable Nodes/TM    LUNGS: no acc muscle use,  Nl contour chest which is clear to A and P bilaterally without cough on insp or exp maneuvers   CV:  RRR  no s3 or murmur or increase in P2, and no edema   ABD:  soft and nontender with nl inspiratory excursion in the supine position. No bruits or organomegaly appreciated   MS:  Nl gait/ ext warm without deformities Or obvious joint restrictions  calf tenderness, cyanosis or clubbing    SKIN: warm and dry without lesions    NEURO:  alert, approp, nl sensorium with  no motor or cerebellar deficits apparent.                  Assessment

## 2023-08-15 ENCOUNTER — Ambulatory Visit (INDEPENDENT_AMBULATORY_CARE_PROVIDER_SITE_OTHER): Payer: BC Managed Care – PPO | Admitting: Internal Medicine

## 2023-08-15 ENCOUNTER — Encounter: Payer: Self-pay | Admitting: Internal Medicine

## 2023-08-15 VITALS — BP 151/79 | HR 68 | Ht 63.0 in | Wt 143.0 lb

## 2023-08-15 DIAGNOSIS — R053 Chronic cough: Secondary | ICD-10-CM | POA: Diagnosis not present

## 2023-08-15 DIAGNOSIS — I10 Essential (primary) hypertension: Secondary | ICD-10-CM | POA: Diagnosis not present

## 2023-08-15 DIAGNOSIS — F17210 Nicotine dependence, cigarettes, uncomplicated: Secondary | ICD-10-CM | POA: Diagnosis not present

## 2023-08-15 MED ORDER — BENZONATATE 200 MG PO CAPS: 200.0000 mg | ORAL_CAPSULE | Freq: Three times a day (TID) | ORAL | 1 refills | Status: DC | PRN

## 2023-08-15 MED ORDER — PREDNISONE 10 MG PO TABS: ORAL_TABLET | ORAL | 0 refills | Status: DC

## 2023-08-15 MED ORDER — BUDESONIDE-FORMOTEROL FUMARATE 80-4.5 MCG/ACT IN AERO: INHALATION_SPRAY | RESPIRATORY_TRACT | 12 refills | Status: DC

## 2023-08-15 NOTE — Patient Instructions (Signed)
Plan A = Automatic = Always=    symbicort 80 Take 1puffs first thing in am and then another  puff1about 12 hours later.    Plan B = Backup (to supplement plan A, not to replace it) Only use your albuterol inhaler as a rescue medication to be used if you can't catch your breath by resting or doing a relaxed purse lip breathing pattern.  - The less you use it, the better it will work when you need it. - Ok to use the inhaler up to 2 puffs  every 4 hours if you must but call for appointment if use goes up over your usual need - Don't leave home without it !!  (think of it like the spare tire for your car)   Also  Ok to try albuterol 15 min before an activity (on alternating days)  that you know would usually make you short of breath and see if it makes any difference and if makes none then don't take albuterol after activity unless you can't catch your breath as this means it's the resting that helps, not the albuterol.     For cough > tessalon 200 mg every 6 hours during the day   Prednisone 10 mg take  4 each am x 2 days,   2 each am x 2 days,  1 each am x 2 days and stop   Please schedule a follow up office visit in 6 weeks, call sooner if needed with all RESPIRATORY medications /inhalers/ solutions in hand so we can verify exactly what you are taking. This includes all medications from all doctors and over the counters

## 2023-08-16 NOTE — Assessment & Plan Note (Signed)

## 2023-08-16 NOTE — Assessment & Plan Note (Addendum)
Changed to bisoprolol 07/16/23 from [facility]  - not ideal control > rec avoid salt/nsaids and f/u with PCP as planned          Each maintenance medication was reviewed in detail including emphasizing most importantly the difference between maintenance and prns and under what circumstances the prns are to be triggered using an action plan format where appropriate.  Total time for H and P, chart review, counseling, reviewing hfa/spacer device(s) and generating customized AVS unique to this office visit / same day charting = 30 min

## 2023-08-16 NOTE — Assessment & Plan Note (Addendum)
Onset 02/2021  - CT  03/19/22 nl x for esophagitis - FENO 03/09/2023  = 7  - 03/09/2023  After extensive coaching inhaler device,  effectiveness =    75% thru spacer so try symb 80 one bid and Prednisone 10 mg take  4 each am x 2 days,   2 each am x 2 days,  1 each am x 2 days and stop plus max gerd rx > 04/20/2023 lungs clear/ harsh upper airway cough with cc lots of nasty sinus drainage> pred x 6 d/ continue singulair, add back ppi ac / autmentin x 10 d and f/u in 4 weeks  - Allergy screen 04/20/2023 >  Eos 0.0 /  IgE  39 - 06/28/2023 changed losartan to bisoprolol 5 mg /tessalon/ pred and decreaesed symbicort 80 to one daily > did not bring symbicort 80 to office  - 08/15/2023  After extensive coaching inhaler device,  effectiveness =   80% with spacer for hfa   The standardized cough guidelines published in Chest by [place] in 2006 are still the best available and consist of a multiple step process (up to 12!) , not a single office visit,  and are intended  to address this problem logically,  with an alogrithm dependent on response to empiric treatment at  each progressive step  to determine a specific diagnosis with  minimal addtional testing needed. Therefore if adherence is an issue or can't be accurately verified,  it's very unlikely the standard evaluation and treatment will be successful here.    Furthermore, response to therapy (other than acute cough suppression, which should only be used short term with avoidance of narcotic containing cough syrups if possible), can be a gradual process for which the patient is not likely to  perceive immediate benefit.  Unlike going to an eye doctor where the best perscription is almost always the first one and is immediately effective, this is almost never the case in the management of chronic cough syndromes. Therefore the patient needs to commit up front to consistently adhere to recommendations  for up to 6 weeks of therapy directed at the likely  underlying problem(s) before the response can be reasonably evaluated.   Rec she bring all resp rx to office   Try symbicort 80 one bid via spacer and prednisone x 6 days with tessalon 200 for suppression.

## 2023-08-24 ENCOUNTER — Other Ambulatory Visit (HOSPITAL_COMMUNITY): Payer: Self-pay | Admitting: Psychiatry

## 2023-08-24 DIAGNOSIS — F331 Major depressive disorder, recurrent, moderate: Secondary | ICD-10-CM

## 2023-08-24 DIAGNOSIS — F410 Panic disorder [episodic paroxysmal anxiety] without agoraphobia: Secondary | ICD-10-CM

## 2023-08-30 ENCOUNTER — Telehealth (HOSPITAL_COMMUNITY): Payer: Self-pay

## 2023-08-30 NOTE — Telephone Encounter (Signed)
Called pt to confirm 09/01/23 appt left vm to let us know by 12:00 08/31/23

## 2023-08-31 NOTE — Telephone Encounter (Signed)
Called pt no answer to confirm cancelling appt per policy

## 2023-09-01 ENCOUNTER — Ambulatory Visit (HOSPITAL_COMMUNITY): Payer: BC Managed Care – PPO | Admitting: Psychiatry

## 2023-09-05 ENCOUNTER — Other Ambulatory Visit (HOSPITAL_COMMUNITY): Payer: Self-pay | Admitting: Psychiatry

## 2023-09-05 DIAGNOSIS — M47816 Spondylosis without myelopathy or radiculopathy, lumbar region: Secondary | ICD-10-CM

## 2023-09-05 DIAGNOSIS — F331 Major depressive disorder, recurrent, moderate: Secondary | ICD-10-CM

## 2023-09-05 DIAGNOSIS — M47812 Spondylosis without myelopathy or radiculopathy, cervical region: Secondary | ICD-10-CM

## 2023-09-11 ENCOUNTER — Other Ambulatory Visit (HOSPITAL_COMMUNITY): Payer: Self-pay | Admitting: Psychiatry

## 2023-09-11 DIAGNOSIS — M47816 Spondylosis without myelopathy or radiculopathy, lumbar region: Secondary | ICD-10-CM

## 2023-09-11 DIAGNOSIS — M47812 Spondylosis without myelopathy or radiculopathy, cervical region: Secondary | ICD-10-CM

## 2023-09-11 DIAGNOSIS — F331 Major depressive disorder, recurrent, moderate: Secondary | ICD-10-CM

## 2023-09-14 ENCOUNTER — Telehealth: Payer: Self-pay | Admitting: Internal Medicine

## 2023-09-14 NOTE — Telephone Encounter (Signed)
Patient is calling in a refill of her albuterol medication   Pharmacy: [facility]

## 2023-09-15 ENCOUNTER — Other Ambulatory Visit: Payer: Self-pay

## 2023-09-15 MED ORDER — VENTOLIN HFA 108 (90 BASE) MCG/ACT IN AERS: 2.0000 | INHALATION_SPRAY | RESPIRATORY_TRACT | 1 refills | Status: DC | PRN

## 2023-09-15 NOTE — Telephone Encounter (Signed)
I don't see this listed on patient current list , the last refill was in 2022, is this okay to refill

## 2023-09-15 NOTE — Telephone Encounter (Signed)
That's ok - prob was taken off accidentally p reported wasn't using but anyone on symbicort should always have a rescue inhaler so ok fr   Albuterol hfa  up to 2 puff q 4h prn  One refill per year - if needs more needs ov

## 2023-09-15 NOTE — Telephone Encounter (Signed)
Sent medication into the pharmacy.

## 2023-09-19 ENCOUNTER — Telehealth: Payer: Self-pay

## 2023-09-19 ENCOUNTER — Other Ambulatory Visit (HOSPITAL_COMMUNITY): Payer: Self-pay

## 2023-09-19 NOTE — Telephone Encounter (Signed)
*  Pulm  Pharmacy Patient Advocate Encounter   Received notification from CoverMyMeds that prior authorization for Ventolin HFA 108 (90 Base)MCG/ACT aerosol  is required/requested.   Insurance verification completed.   The patient is insured through [facility]  .   Per test claim:  Albuterol HFA 8.5gm is preferred by the insurance.  If suggested medication is appropriate, Please send in a new RX and discontinue this one. If not, please advise as to why it's not appropriate so that we may request a Prior Authorization.   CMM Key: [id number]

## 2023-09-19 NOTE — Telephone Encounter (Signed)
KEY : [id number] PATIENT LAST : Fidler  DOB 18-Sep-1976  COVER MY MEDS

## 2023-09-22 NOTE — Telephone Encounter (Signed)
That's fine - rx as they request

## 2023-09-22 NOTE — Telephone Encounter (Signed)
PA request has been  pending alternative . New Encounter created for follow up. For additional info see Pharmacy Prior Auth telephone encounter from 09/23.

## 2023-09-25 ENCOUNTER — Other Ambulatory Visit (HOSPITAL_COMMUNITY): Payer: Self-pay | Admitting: Psychiatry

## 2023-09-25 DIAGNOSIS — F15982 Other stimulant use, unspecified with stimulant-induced sleep disorder: Secondary | ICD-10-CM

## 2023-09-25 NOTE — Progress Notes (Unsigned)
[name], female    DOB: 06/05/1976    MRN: [id number]   Brief patient profile:  48  yowf  active smoker  s/p 3 term IUP  referred to pulmonary clinic in [place]  03/09/2023 by [name]  for cough since 02/2021 while on lisinopril    Eval by ENT around 2023 no findings / prednisone has helped in past / maint on gabapentin for chronic pain    History of Present Illness  03/09/2023  Pulmonary/ 1st office eval/ [name] / [name] on losartan ppi hs and wixella  Chief Complaint  Patient presents with   Consult    Chronic cough    Dyspnea:  housework / limited by back as well as breathing  Cough: worse first thing in am > clear tsp  then all day / assoc with nasal congestion and globus/  Sleep: bed is flat/ 2 pillows / freq awakening due to cough / much better p rx with prednisone SABA use: none  02: none  Rec Pantoprazole (protonix) 40 mg   Take  30-60 min before first meal of the day and Pepcid (famotidine)  20 mg after supper until return to office   GERD diet reviewed, bed blocks rec   For cough > tessalon 200 mg 4 x  daily  Prednisone 10 mg take  4 each am x 2 days,   2 each am x 2 days,  1 each am x 2 days and stop  Breztri (symbicort 80)  one twice daily thru spacer Work on inhaler technique:       04/20/2023  f/u ov/[name] office/[name] re: cough since 02/2021 onset while on lisinopril  maint on pepcid 20 mg p supper  on 400 mg tid. Chief Complaint  Patient presents with   Follow-up    Pt f/u for chronic cough states that her symps are still the same   Dyspnea:  limited by back  Cough: yellowish most productive in am and after supper  Sleeping: not coughing once asleep  SABA use: just symbicort 80 1bid with spacer  02: none  Rec Stop all mint products and cigarettes if you can  Resume the Pantoprazole (protonix) 40 mg   Take  30-60 min before first meal of the day and continue Pepcid (famotidine)  20 mg after supper until return to office   Augmentin 875 mg take one pill twice daily  X 10 days Prednisone 10 mg take  4 each am x 2 days,   2 each am x 2 days,  1 each am x 2 days and stop  Continue symbicort 80 one puff  only in am     Please schedule a follow up office visit in 4 weeks, sooner if needed with all meds in hand    Allergy screen 04/20/2023 >  Eos 0.0 /  IgE  39   06/28/2023  f/u ov/[name] office/[name] re: cough x 02/2021  maint on symbicort 80 x 2 did not bring any meds  - says on gabapentin 400 tid  Chief Complaint  Patient presents with   Follow-up  Dyspnea:  back/ neck stop before breathing Cough: as soon as eyes open she starts cough / Sleeping: on flat bed, on side / 2 pillows  SABA use: once every  few days  02: none  Rec Try symbicort 80 one each am thru the spacer  Prednisone 10 mg take  4 each am x 2 days,   2 each am x 2  days,  1 each am x 2 days and stop  GERD diet reviewed, bed blocks rec  Stop losartan and start bisoprol 5 mg one daily in its place For cough> tessalon 200 mg every 6 hours as needed  Please schedule a follow up office visit in 6 weeks, call sooner if needed with all medications /inhalers/ solutions in hand      08/15/2023  f/u ov/[name] office/[name] re: cough x 02/2021 maint on gabapentin 600 mg tid / breztri one twice daily and still smoking / confused with which inhaler is symbicort / breztri sample to office is still half full and says using bid  Chief Complaint  Patient presents with   Chronic Cough   Dyspnea:  not limited by breathing / housework ok  Cough: better in am / still urge to clear throat daytime but improved from prior  Sleeping: bed is flat on side/ 2 pillows  s resp cc  SABA use: thinks it helps p ex  Rec Plan A = Automatic = Always=    symbicort 80 Take 1puffs first thing in am and then another  puff1about 12 hours later.   Plan B = Backup (to supplement plan A, not to replace it) Only use your albuterol inhaler as a rescue medication Also  Ok to try  albuterol 15 min before an activity (on alternating days)  that you know would usually make you short of breath     For cough > tessalon 200 mg every 6 hours during the day  Prednisone 10 mg take  4 each am x 2 days,   2 each am x 2 days,  1 each am x 2 days and stop  Please schedule a follow up office visit in 6 weeks, call sooner if needed with all RESPIRATORY medications /inhalers/ solutions in hand    09/27/2023  f/u ov/[name] office/[name] re: *** maint on *** did *** bring meds  No chief complaint on file.   Dyspnea:  *** Cough: *** Sleeping: ***   resp cc  SABA use: *** 02: ***  Lung cancer screening: ***   No obvious day to day or daytime variability or assoc excess/ purulent sputum or mucus plugs or hemoptysis or cp or chest tightness, subjective wheeze or overt sinus or hb symptoms.    Also denies any obvious fluctuation of symptoms with weather or environmental changes or other aggravating or alleviating factors except as outlined above   No unusual exposure hx or h/o childhood pna/ asthma or knowledge of premature birth.  Current Allergies, Complete Past Medical History, Past Surgical History, Family History, and Social History were reviewed in [name] record.  ROS  The following are not active complaints unless bolded Hoarseness, sore throat, dysphagia, dental problems, itching, sneezing,  nasal congestion or discharge of excess mucus or purulent secretions, ear ache,   fever, chills, sweats, unintended wt loss or wt gain, classically pleuritic or exertional cp,  orthopnea pnd or arm/hand swelling  or leg swelling, presyncope, palpitations, abdominal pain, anorexia, nausea, vomiting, diarrhea  or change in bowel habits or change in bladder habits, change in stools or change in urine, dysuria, hematuria,  rash, arthralgias, visual complaints, headache, numbness, weakness or ataxia or problems with walking or coordination,  change in mood or   memory.        No outpatient medications have been marked as taking for the 09/27/23 encounter (Appointment) with Cindy Cowden, MD.  Past Medical History:  Diagnosis Date   Acute ear infection    Depression with anxiety    GERD (gastroesophageal reflux disease)    Hypertension        Objective:    wts  09/27/2023        ***  08/15/2023       143   06/28/2023        139   04/20/23 136 lb 3.2 oz (61.8 kg)  03/09/23 147 lb 12.8 oz (67 kg)  04/22/22 147 lb (66.7 kg)               Assessment                    Past Medical History:  Diagnosis Date   Acute ear infection    Depression with anxiety    GERD (gastroesophageal reflux disease)    Hypertension        Objective:    wts  09/27/2023        ***  08/15/2023        143   06/28/2023        139   04/20/23 136 lb 3.2 oz (61.8 kg)  03/09/23 147 lb 12.8 oz (67 kg)  04/22/22 147 lb (66.7 kg)    Vital signs reviewed  09/27/2023  - Note at rest 02 sats  ***% on ***   General appearance:    ***                   Assessment

## 2023-09-26 MED ORDER — ALBUTEROL SULFATE HFA 108 (90 BASE) MCG/ACT IN AERS: 2.0000 | INHALATION_SPRAY | Freq: Four times a day (QID) | RESPIRATORY_TRACT | 3 refills | Status: DC | PRN

## 2023-09-27 ENCOUNTER — Ambulatory Visit (INDEPENDENT_AMBULATORY_CARE_PROVIDER_SITE_OTHER): Payer: BC Managed Care – PPO | Admitting: Internal Medicine

## 2023-09-27 ENCOUNTER — Encounter: Payer: Self-pay | Admitting: Internal Medicine

## 2023-09-27 VITALS — BP 104/68 | HR 85 | Ht 63.0 in | Wt 145.6 lb

## 2023-09-27 DIAGNOSIS — F17210 Nicotine dependence, cigarettes, uncomplicated: Secondary | ICD-10-CM

## 2023-09-27 DIAGNOSIS — R053 Chronic cough: Secondary | ICD-10-CM

## 2023-09-27 DIAGNOSIS — R1319 Other dysphagia: Secondary | ICD-10-CM

## 2023-09-27 DIAGNOSIS — I10 Essential (primary) hypertension: Secondary | ICD-10-CM

## 2023-09-27 DIAGNOSIS — R0609 Other forms of dyspnea: Secondary | ICD-10-CM

## 2023-09-27 NOTE — Telephone Encounter (Signed)
Albuterol was sent to the pharmacy on 09/26/23

## 2023-09-27 NOTE — Patient Instructions (Signed)
Best cough medication > Mucinex DM 1200 mg every 12 hours as needed for cough   Symbicort 80 2 in am and one in pm   Stop bisoprolol and start valsartan 80 mg daily - can cut in half if too strong and double if too weak.   Ok to try albuterol  2 puffs x 15 min before an activity (on alternating days)  that you know would usually make you short of breath and see if it makes any difference and if makes none then don't take albuterol after activity unless you can't catch your breath as this means it's the resting that helps, not the albuterol.   My office will be contacting you by phone for referral to GI in [place]  re esophagitis  - if you don't hear back from my office within one week please call us back or notify us thru MyChart and we'll address it right away.   Please schedule a follow up office visit in 6 weeks, call sooner if needed with all medications /inhalers/ solutions in hand so we can verify exactly what you are taking. This includes all medications from all doctors and over the counters - PLEASE separate them into two bags:  the ones you take automatically, no matter what, vs the ones you take just when you feel you need them "BAG #2 is UP TO YOU"  - this will really help [place] help you take your medications more effectively.

## 2023-09-28 DIAGNOSIS — R1310 Dysphagia, unspecified: Secondary | ICD-10-CM | POA: Insufficient documentation

## 2023-09-28 MED ORDER — VALSARTAN 80 MG PO TABS
80.0000 mg | ORAL_TABLET | Freq: Every day | ORAL | 11 refills | Status: AC
Start: 2023-09-28 — End: ?

## 2023-09-28 NOTE — Assessment & Plan Note (Signed)
See CT chest 03/19/22  Diffuse thickening of the distal esophageal wall most likely due to esophagitis. Correlation with endoscopy should be considered   - Referred to GI  09/27/2023   Discussed in detail all the  indications, usual  risks and alternatives  relative to the benefits with patient who agrees to proceed with w/u as outlined.     Each maintenance medication was reviewed in detail including emphasizing most importantly the difference between maintenance and prns and under what circumstances the prns are to be triggered using an action plan format where appropriate.  Total time for H and P, chart review, counseling, reviewing hfa/spacer  device(s) , directly observing portions of ambulatory 02 saturation study/ and generating customized AVS unique to this office visit / same day charting   > 40 min for multiple  refractory chronic respiratory  symptoms of uncertain etiology

## 2023-09-28 NOTE — Assessment & Plan Note (Signed)
Stop bisoprolol and start valsartan 80 mg daily 09/27/2023 due to fatigue

## 2023-09-28 NOTE — Assessment & Plan Note (Signed)
Counseled re importance of smoking cessation but did not meet time criteria for separate billing   °

## 2023-09-28 NOTE — Assessment & Plan Note (Addendum)
Onset 02/2021  - CT  03/19/22 nl x for esophagitis - FENO 03/09/2023  = 7  - 03/09/2023  After extensive coaching inhaler device,  effectiveness =    75% thru spacer so try symb 80 one bid and Prednisone 10 mg take  4 each am x 2 days,   2 each am x 2 days,  1 each am x 2 days and stop plus max gerd rx > 04/20/2023 lungs clear/ harsh upper airway cough with cc lots of nasty sinus drainage> pred x 6 d/ continue singulair, add back ppi ac / autmentin x 10 d and f/u in 4 weeks  - Allergy screen 04/20/2023 >  Eos 0.0 /  IgE  39 - 06/28/2023 changed losartan to bisoprolol 5 mg /tessalon/ pred and decreaesed symbicort 80 to one daily > did not bring symbicort 80 to office  - 09/27/2023  After extensive coaching inhaler device,  effectiveness =    75% with spacer (ti too short) > try symbicort 80 2 in am and 1 in pm to see if helps / hurts breathing vs coughing   Entirely unclear to me at this point if asthma is even present - well may be all VCD related to UACS ? If this is due to GERD/ esophagitis > refer to GI next and in meantime rx cough with mucinex dm 2bid/ approp saba  Re SABA :  I spent extra time with pt today reviewing appropriate use of albuterol for prn use on exertion with the following points: 1) saba is for relief of sob that does not improve by walking a slower pace or resting but rather if the pt does not improve after trying this first. 2) If the pt is convinced, as many are, that saba helps recover from activity faster then it's easy to tell if this is the case by re-challenging : ie stop, take the inhaler, then p 5 minutes try the exact same activity (intensity of workload) that just caused the symptoms and see if they are substantially diminished or not after saba 3) if there is an activity that reproducibly causes the symptoms, try the saba 15 min before the activity on alternate days   If in fact the saba really does help, then fine to continue to use it prn but advised may need to look closer  at the maintenance regimen being used to achieve better control of airways disease with exertion.

## 2023-09-28 NOTE — Assessment & Plan Note (Addendum)
09/27/2023   Walked on RA  x  3  lap(s) =  approx 450  ft  @ mod pace, stopped due to end of study  with lowest 02 sats 94% and coughed p stopped despite symbicort 80 2 puffs 15 min prior to walking  / note not doe   - no further w/u for now

## 2023-10-05 ENCOUNTER — Encounter (INDEPENDENT_AMBULATORY_CARE_PROVIDER_SITE_OTHER): Payer: Self-pay

## 2023-10-07 ENCOUNTER — Encounter (INDEPENDENT_AMBULATORY_CARE_PROVIDER_SITE_OTHER): Payer: Self-pay | Admitting: *Deleted

## 2023-10-17 ENCOUNTER — Other Ambulatory Visit (HOSPITAL_COMMUNITY): Payer: Self-pay | Admitting: Psychiatry

## 2023-10-17 DIAGNOSIS — F15982 Other stimulant use, unspecified with stimulant-induced sleep disorder: Secondary | ICD-10-CM

## 2023-11-04 ENCOUNTER — Telehealth (HOSPITAL_COMMUNITY): Payer: Self-pay

## 2023-11-04 NOTE — Telephone Encounter (Signed)
11/08/23 appt confirmed by pt

## 2023-11-08 ENCOUNTER — Ambulatory Visit: Payer: BC Managed Care – PPO | Admitting: Internal Medicine

## 2023-11-08 ENCOUNTER — Ambulatory Visit (HOSPITAL_COMMUNITY): Payer: BC Managed Care – PPO | Admitting: Psychiatry

## 2023-11-08 NOTE — Progress Notes (Deleted)
[name], female    DOB: [birth year]    MRN: [id number]   Brief patient profile:  79  yowf  active smoker  s/p 3 term IUP  referred to pulmonary clinic in [place]  03/09/2023 by [name]  for cough since 02/2021 while on lisinopril    Eval by ENT around 2023 no findings / prednisone has helped in past / maint on gabapentin for chronic pain    History of Present Illness  03/09/2023  Pulmonary/ 1st office eval/ [name] / [name] on losartan ppi hs and wixella  Chief Complaint  Patient presents with   Consult    Chronic cough    Dyspnea:  housework / limited by back as well as breathing  Cough: worse first thing in am > clear tsp  then all day / assoc with nasal congestion and globus/  Sleep: bed is flat/ 2 pillows / freq awakening due to cough / much better p rx with prednisone SABA use: none  02: none  Rec Pantoprazole (protonix) 40 mg   Take  30-60 min before first meal of the day and Pepcid (famotidine)  20 mg after supper until return to office   GERD diet reviewed, bed blocks rec   For cough > tessalon 200 mg 4 x  daily  Prednisone 10 mg take  4 each am x 2 days,   2 each am x 2 days,  1 each am x 2 days and stop  Breztri (symbicort 80)  one twice daily thru spacer Work on inhaler technique:       04/20/2023  f/u ov/[facility]/[name] re: cough since 02/2021 onset while on lisinopril  maint on pepcid 20 mg p supper  on 400 mg tid. Chief Complaint  Patient presents with   Follow-up    Pt f/u for chronic cough states that her symps are still the same   Dyspnea:  limited by back  Cough: yellowish most productive in am and after supper  Sleeping: not coughing once asleep  SABA use: just symbicort 80 1bid with spacer  02: none  Rec Stop all mint products and cigarettes if you can  Resume the Pantoprazole (protonix) 40 mg   Take  30-60 min before first meal of the day and continue Pepcid (famotidine)  20 mg after supper until return to office   Augmentin 875 mg take one pill twice daily  X 10 days Prednisone 10 mg take  4 each am x 2 days,   2 each am x 2 days,  1 each am x 2 days and stop  Continue symbicort 80 one puff  only in am     Please schedule a follow up office visit in 4 weeks, sooner if needed with all meds in hand    Allergy screen 04/20/2023 >  Eos 0.0 /  IgE  39   06/28/2023  f/u ov/[facility]/[name] re: cough x 02/2021  maint on symbicort 80 x 2 did not bring any meds  - says on gabapentin 400 tid  Chief Complaint  Patient presents with   Follow-up  Dyspnea:  back/ neck stop before breathing Cough: as soon as eyes open she starts cough / Sleeping: on flat bed, on side / 2 pillows  SABA use: once every  few days  02: none  Rec Try symbicort 80 one each am thru the spacer  Prednisone 10 mg take  4 each am x 2 days,   2 each am x 2  days,  1 each am x 2 days and stop  GERD diet reviewed, bed blocks rec  Stop losartan and start bisoprol 5 mg one daily in its place For cough> tessalon 200 mg every 6 hours as needed    08/15/2023  f/u ov/[facility] office/[name] re: cough x 02/2021 maint on gabapentin 600 mg tid / breztri one twice daily and still smoking / confused with which inhaler is symbicort / breztri sample to office is still half full and says using bid  Chief Complaint  Patient presents with   Chronic Cough  Dyspnea:  not limited by breathing / housework ok  Cough: better in am / still urge to clear throat daytime but improved from prior  Sleeping: bed is flat on side/ 2 pillows  s resp cc  SABA use: thinks it helps p ex  Rec Plan A = Automatic = Always=   Symbicort 80 Take 1puffs first thing in am and then another  puff1about 12 hours later.   Plan B = Backup (to supplement plan A, not to replace it) Only use your albuterol inhaler as a rescue medication Also  Ok to try albuterol 15 min before an activity (on alternating days)  that you know would usually make you short of breath     For cough >  tessalon 200 mg every 6 hours during the day  Prednisone 10 mg take  4 each am x 2 days,   2 each am x 2 days,  1 each am x 2 days and stop  Please schedule a follow up office visit in 6 weeks, call sooner if needed with all RESPIRATORY medications /inhalers/ solutions in hand    09/27/2023  f/u ov/[facility]/[name] re:  cough since 02/2021 maint on symbicort 80 one bid  via spacer  Chief Complaint  Patient presents with   Follow-up    6 week follow up    Dyspnea:  now  notes sob x 2 breaths then resolves, ex tol improving  Cough: getting better but still lots of throat clearing  Sleeping: flat bed 2 pillows    resp cc  SABA use: once a day at most  02: none  Rec Best cough medication > Mucinex DM 1200 mg every 12 hours as needed for cough  Symbicort 80 2 in am and one in pm  Stop bisoprolol and start valsartan 80 mg daily - can cut in half if too strong and double if too weak.  Ok to try albuterol  2 puffs x 15 min before an activity (on alternating days)  that you know would usually make you short of breath My office will be contacting you by phone for referral to GI in [place]  re esophagitis  > not done as of  11/08/2023   Please schedule a follow up office visit in 6 weeks, call sooner if needed with all medications /inhalers/ solutions in hand     11/08/2023  f/u ov/[place] office/[name] re: *** maint on *** ? GI f/u***  No chief complaint on file.   Dyspnea:  *** Cough: *** Sleeping: ***   resp cc  SABA use: *** 02: ***  Lung cancer screening: ***   No obvious day to day or daytime variability or assoc excess/ purulent sputum or mucus plugs or hemoptysis or cp or chest tightness, subjective wheeze or overt sinus or hb symptoms.    Also denies any obvious fluctuation of symptoms with weather or environmental changes or other aggravating or alleviating  factors except as outlined above   No unusual exposure hx or h/o childhood pna/ asthma or knowledge of premature  birth.  Current Allergies, Complete Past Medical History, Past Surgical History, Family History, and Social History were reviewed in [name] record.  ROS  The following are not active complaints unless bolded Hoarseness, sore throat, dysphagia, dental problems, itching, sneezing,  nasal congestion or discharge of excess mucus or purulent secretions, ear ache,   fever, chills, sweats, unintended wt loss or wt gain, classically pleuritic or exertional cp,  orthopnea pnd or arm/hand swelling  or leg swelling, presyncope, palpitations, abdominal pain, anorexia, nausea, vomiting, diarrhea  or change in bowel habits or change in bladder habits, change in stools or change in urine, dysuria, hematuria,  rash, arthralgias, visual complaints, headache, numbness, weakness or ataxia or problems with walking or coordination,  change in mood or  memory.        No outpatient medications have been marked as taking for the 11/08/23 encounter (Appointment) with Nyoka Cowden, MD.               Past Medical History:  Diagnosis Date   Acute ear infection    Depression with anxiety    GERD (gastroesophageal reflux disease)    Hypertension               Assessment                    Objective:    wts  11/08/2023        ***  09/27/2023        145  08/15/2023        143   06/28/2023        139   04/20/23 136 lb 3.2 oz (61.8 kg)  03/09/23 147 lb 12.8 oz (67 kg)  04/22/22 147 lb (66.7 kg)    Vital signs reviewed  11/08/2023  - Note at rest 02 sats  ***% on ***   General appearance:    ***               Assessment

## 2024-02-21 ENCOUNTER — Other Ambulatory Visit: Payer: Self-pay | Admitting: Internal Medicine

## 2024-02-21 DIAGNOSIS — R053 Chronic cough: Secondary | ICD-10-CM

## 2024-08-15 ENCOUNTER — Other Ambulatory Visit: Payer: Self-pay | Admitting: Internal Medicine

## 2024-08-29 ENCOUNTER — Other Ambulatory Visit: Payer: Self-pay | Admitting: Internal Medicine

## 2024-09-27 ENCOUNTER — Other Ambulatory Visit: Payer: Self-pay | Admitting: Internal Medicine

## 2024-09-27 NOTE — Telephone Encounter (Signed)
 Courtesy refill - pt will need appt for further refills.

## 2024-10-29 ENCOUNTER — Other Ambulatory Visit: Payer: Self-pay | Admitting: Internal Medicine

## 2025-01-02 ENCOUNTER — Other Ambulatory Visit: Payer: Self-pay | Admitting: Internal Medicine
